# Patient Record
Sex: Female | Born: 1945 | Race: White | Hispanic: No | Marital: Married | Smoking: Never smoker
Health system: Southern US, Community
[De-identification: ages and names within clinical notes are randomized; demographics above are authoritative.]

## PROBLEM LIST (undated history)

## (undated) DIAGNOSIS — I10 Essential (primary) hypertension: Secondary | ICD-10-CM

## (undated) DIAGNOSIS — Z86010 Personal history of colonic polyps: Secondary | ICD-10-CM

## (undated) DIAGNOSIS — J309 Allergic rhinitis, unspecified: Secondary | ICD-10-CM

## (undated) DIAGNOSIS — D509 Iron deficiency anemia, unspecified: Secondary | ICD-10-CM

## (undated) DIAGNOSIS — E785 Hyperlipidemia, unspecified: Secondary | ICD-10-CM

## (undated) HISTORY — PX: CATARACT EXTRACTION: SUR2

## (undated) HISTORY — DX: Iron deficiency anemia, unspecified: D50.9

## (undated) HISTORY — PX: ABDOMINAL HYSTERECTOMY: SHX81

## (undated) HISTORY — DX: Hyperlipidemia, unspecified: E78.5

## (undated) HISTORY — DX: Allergic rhinitis, unspecified: J30.9

## (undated) HISTORY — DX: Essential (primary) hypertension: I10

## (undated) HISTORY — DX: Personal history of colonic polyps: Z86.010

## (undated) HISTORY — PX: TONSILLECTOMY: SUR1361

---

## 1997-11-06 ENCOUNTER — Ambulatory Visit (HOSPITAL_COMMUNITY): Admission: RE | Admit: 1997-11-06 | Discharge: 1997-11-06 | Payer: Self-pay | Admitting: Obstetrics and Gynecology

## 1997-11-13 ENCOUNTER — Ambulatory Visit (HOSPITAL_COMMUNITY): Admission: RE | Admit: 1997-11-13 | Discharge: 1997-11-13 | Payer: Self-pay | Admitting: Obstetrics and Gynecology

## 1999-01-23 ENCOUNTER — Ambulatory Visit (HOSPITAL_COMMUNITY): Admission: RE | Admit: 1999-01-23 | Discharge: 1999-01-23 | Payer: Self-pay | Admitting: Obstetrics and Gynecology

## 1999-01-23 ENCOUNTER — Encounter: Payer: Self-pay | Admitting: Obstetrics and Gynecology

## 1999-01-28 ENCOUNTER — Ambulatory Visit (HOSPITAL_COMMUNITY): Admission: RE | Admit: 1999-01-28 | Discharge: 1999-01-28 | Payer: Self-pay | Admitting: Obstetrics and Gynecology

## 1999-01-28 ENCOUNTER — Encounter: Payer: Self-pay | Admitting: Obstetrics and Gynecology

## 1999-06-20 ENCOUNTER — Other Ambulatory Visit: Admission: RE | Admit: 1999-06-20 | Discharge: 1999-06-20 | Payer: Self-pay | Admitting: Obstetrics and Gynecology

## 1999-10-21 ENCOUNTER — Ambulatory Visit (HOSPITAL_COMMUNITY): Admission: RE | Admit: 1999-10-21 | Discharge: 1999-10-21 | Payer: Self-pay | Admitting: *Deleted

## 2000-05-18 ENCOUNTER — Encounter: Payer: Self-pay | Admitting: Obstetrics and Gynecology

## 2000-05-18 ENCOUNTER — Ambulatory Visit (HOSPITAL_COMMUNITY): Admission: RE | Admit: 2000-05-18 | Discharge: 2000-05-18 | Payer: Self-pay | Admitting: Obstetrics and Gynecology

## 2000-08-19 ENCOUNTER — Other Ambulatory Visit: Admission: RE | Admit: 2000-08-19 | Discharge: 2000-08-19 | Payer: Self-pay | Admitting: Obstetrics and Gynecology

## 2002-11-28 LAB — HM MAMMOGRAPHY: HM Mammogram: NORMAL

## 2004-03-25 ENCOUNTER — Ambulatory Visit (HOSPITAL_COMMUNITY): Admission: RE | Admit: 2004-03-25 | Discharge: 2004-03-25 | Payer: Self-pay | Admitting: *Deleted

## 2004-03-25 ENCOUNTER — Encounter (INDEPENDENT_AMBULATORY_CARE_PROVIDER_SITE_OTHER): Payer: Self-pay | Admitting: *Deleted

## 2007-02-23 ENCOUNTER — Emergency Department (HOSPITAL_COMMUNITY): Admission: EM | Admit: 2007-02-23 | Discharge: 2007-02-23 | Payer: Self-pay | Admitting: Emergency Medicine

## 2007-07-26 ENCOUNTER — Ambulatory Visit (HOSPITAL_COMMUNITY): Admission: RE | Admit: 2007-07-26 | Discharge: 2007-07-26 | Payer: Self-pay | Admitting: *Deleted

## 2007-07-26 ENCOUNTER — Encounter (INDEPENDENT_AMBULATORY_CARE_PROVIDER_SITE_OTHER): Payer: Self-pay | Admitting: *Deleted

## 2007-12-02 ENCOUNTER — Ambulatory Visit: Payer: Self-pay | Admitting: Internal Medicine

## 2007-12-02 DIAGNOSIS — D509 Iron deficiency anemia, unspecified: Secondary | ICD-10-CM | POA: Insufficient documentation

## 2007-12-02 DIAGNOSIS — Z860100 Personal history of colon polyps, unspecified: Secondary | ICD-10-CM

## 2007-12-02 DIAGNOSIS — I10 Essential (primary) hypertension: Secondary | ICD-10-CM | POA: Insufficient documentation

## 2007-12-02 DIAGNOSIS — J309 Allergic rhinitis, unspecified: Secondary | ICD-10-CM | POA: Insufficient documentation

## 2007-12-02 HISTORY — DX: Personal history of colonic polyps: Z86.010

## 2007-12-02 HISTORY — DX: Essential (primary) hypertension: I10

## 2007-12-02 HISTORY — DX: Iron deficiency anemia, unspecified: D50.9

## 2007-12-02 HISTORY — DX: Personal history of colon polyps, unspecified: Z86.0100

## 2007-12-02 HISTORY — DX: Allergic rhinitis, unspecified: J30.9

## 2007-12-06 ENCOUNTER — Ambulatory Visit: Payer: Self-pay | Admitting: Internal Medicine

## 2007-12-06 LAB — CONVERTED CEMR LAB
ALT: 27 units/L (ref 0–35)
AST: 29 units/L (ref 0–37)
Albumin: 3.8 g/dL (ref 3.5–5.2)
Alkaline Phosphatase: 85 units/L (ref 39–117)
BUN: 13 mg/dL (ref 6–23)
Basophils Absolute: 0 10*3/uL (ref 0.0–0.1)
Basophils Relative: 0.8 % (ref 0.0–3.0)
Bilirubin Urine: NEGATIVE
Bilirubin, Direct: 0.2 mg/dL (ref 0.0–0.3)
CO2: 29 meq/L (ref 19–32)
Calcium: 9.4 mg/dL (ref 8.4–10.5)
Chloride: 106 meq/L (ref 96–112)
Cholesterol: 211 mg/dL (ref 0–200)
Creatinine, Ser: 0.6 mg/dL (ref 0.4–1.2)
Crystals: NEGATIVE
Direct LDL: 128.6 mg/dL
Eosinophils Absolute: 0.1 10*3/uL (ref 0.0–0.7)
Eosinophils Relative: 3 % (ref 0.0–5.0)
GFR calc Af Amer: 131 mL/min
GFR calc non Af Amer: 108 mL/min
Glucose, Bld: 103 mg/dL — ABNORMAL HIGH (ref 70–99)
HCT: 36.5 % (ref 36.0–46.0)
HDL: 40.9 mg/dL (ref >39.0)
Hemoglobin, Urine: NEGATIVE
Hemoglobin: 12.9 g/dL (ref 12.0–15.0)
Ketones, ur: NEGATIVE mg/dL
Lymphocytes Relative: 30.7 % (ref 12.0–46.0)
MCHC: 35.4 g/dL (ref 30.0–36.0)
MCV: 92.6 fL (ref 78.0–100.0)
Monocytes Absolute: 0.5 10*3/uL (ref 0.1–1.0)
Monocytes Relative: 13 % — ABNORMAL HIGH (ref 3.0–12.0)
Mucus, UA: NEGATIVE
Neutro Abs: 2.3 10*3/uL (ref 1.4–7.7)
Neutrophils Relative %: 52.5 % (ref 43.0–77.0)
Nitrite: NEGATIVE
Platelets: 205 10*3/uL (ref 150–400)
Potassium: 3.7 meq/L (ref 3.5–5.1)
RBC / HPF: NONE SEEN
RBC: 3.94 M/uL (ref 3.87–5.11)
RDW: 12.2 % (ref 11.5–14.6)
Sodium: 142 meq/L (ref 135–145)
Specific Gravity, Urine: 1.01 (ref 1.000–1.03)
TSH: 0.86 microintl units/mL (ref 0.35–5.50)
Total Bilirubin: 1 mg/dL (ref 0.3–1.2)
Total CHOL/HDL Ratio: 5.2
Total Protein, Urine: NEGATIVE mg/dL
Total Protein: 6.7 g/dL (ref 6.0–8.3)
Triglycerides: 135 mg/dL (ref 0–149)
Urine Glucose: NEGATIVE mg/dL
Urobilinogen, UA: 0.2 (ref 0.0–1.0)
VLDL: 27 mg/dL (ref 0–40)
WBC: 4.2 10*3/uL — ABNORMAL LOW (ref 4.5–10.5)
pH: 7.5 (ref 5.0–8.0)

## 2009-01-24 ENCOUNTER — Telehealth: Payer: Self-pay | Admitting: Internal Medicine

## 2009-03-28 ENCOUNTER — Ambulatory Visit: Payer: Self-pay | Admitting: Internal Medicine

## 2009-03-29 LAB — CONVERTED CEMR LAB
ALT: 24 units/L (ref 0–35)
AST: 24 units/L (ref 0–37)
Albumin: 4.7 g/dL (ref 3.5–5.2)
Alkaline Phosphatase: 86 units/L (ref 39–117)
BUN: 17 mg/dL (ref 6–23)
Basophils Absolute: 0.1 10*3/uL (ref 0.0–0.1)
Basophils Relative: 1.5 % (ref 0.0–3.0)
Bilirubin Urine: NEGATIVE
Bilirubin, Direct: 0.2 mg/dL (ref 0.0–0.3)
CO2: 28 meq/L (ref 19–32)
Calcium: 9.9 mg/dL (ref 8.4–10.5)
Chloride: 101 meq/L (ref 96–112)
Cholesterol: 224 mg/dL — ABNORMAL HIGH (ref 0–200)
Creatinine, Ser: 0.7 mg/dL (ref 0.4–1.2)
Direct LDL: 155.1 mg/dL
Eosinophils Absolute: 0.1 10*3/uL (ref 0.0–0.7)
Eosinophils Relative: 1.3 % (ref 0.0–5.0)
GFR calc non Af Amer: 89.76 mL/min (ref >60)
Glucose, Bld: 102 mg/dL — ABNORMAL HIGH (ref 70–99)
HCT: 42.1 % (ref 36.0–46.0)
HDL: 50.5 mg/dL (ref >39.00)
Hemoglobin, Urine: NEGATIVE
Hemoglobin: 14 g/dL (ref 12.0–15.0)
Ketones, ur: 40 mg/dL
Leukocytes, UA: NEGATIVE
Lymphocytes Relative: 27.8 % (ref 12.0–46.0)
Lymphs Abs: 1.5 10*3/uL (ref 0.7–4.0)
MCHC: 33.3 g/dL (ref 30.0–36.0)
MCV: 94.4 fL (ref 78.0–100.0)
Monocytes Absolute: 0.5 10*3/uL (ref 0.1–1.0)
Monocytes Relative: 9.5 % (ref 3.0–12.0)
Neutro Abs: 3.1 10*3/uL (ref 1.4–7.7)
Neutrophils Relative %: 59.9 % (ref 43.0–77.0)
Nitrite: NEGATIVE
Platelets: 224 10*3/uL (ref 150.0–400.0)
Potassium: 3.1 meq/L — ABNORMAL LOW (ref 3.5–5.1)
RBC: 4.46 M/uL (ref 3.87–5.11)
RDW: 12.3 % (ref 11.5–14.6)
Sodium: 139 meq/L (ref 135–145)
Specific Gravity, Urine: 1.02 (ref 1.000–1.030)
TSH: 0.47 microintl units/mL (ref 0.35–5.50)
Total Bilirubin: 1.2 mg/dL (ref 0.3–1.2)
Total CHOL/HDL Ratio: 4
Total Protein, Urine: NEGATIVE mg/dL
Total Protein: 7.8 g/dL (ref 6.0–8.3)
Triglycerides: 169 mg/dL — ABNORMAL HIGH (ref 0.0–149.0)
Urine Glucose: NEGATIVE mg/dL
Urobilinogen, UA: 0.2 (ref 0.0–1.0)
VLDL: 33.8 mg/dL (ref 0.0–40.0)
WBC: 5.3 10*3/uL (ref 4.5–10.5)
pH: 6 (ref 5.0–8.0)

## 2009-04-26 ENCOUNTER — Ambulatory Visit: Payer: Self-pay | Admitting: Internal Medicine

## 2009-04-26 LAB — CONVERTED CEMR LAB
ALT: 49 units/L — ABNORMAL HIGH (ref 0–35)
AST: 49 units/L — ABNORMAL HIGH (ref 0–37)
Albumin: 3.9 g/dL (ref 3.5–5.2)
Alkaline Phosphatase: 108 units/L (ref 39–117)
BUN: 12 mg/dL (ref 6–23)
Bilirubin, Direct: 0.1 mg/dL (ref 0.0–0.3)
CO2: 29 meq/L (ref 19–32)
Calcium: 9.5 mg/dL (ref 8.4–10.5)
Chloride: 107 meq/L (ref 96–112)
Cholesterol: 182 mg/dL (ref 0–200)
Creatinine, Ser: 0.6 mg/dL (ref 0.4–1.2)
Direct LDL: 89.8 mg/dL
GFR calc non Af Amer: 107.2 mL/min (ref >60)
Glucose, Bld: 101 mg/dL — ABNORMAL HIGH (ref 70–99)
HDL: 42 mg/dL (ref >39.00)
Potassium: 4.1 meq/L (ref 3.5–5.1)
Sodium: 142 meq/L (ref 135–145)
Total Bilirubin: 0.6 mg/dL (ref 0.3–1.2)
Total CHOL/HDL Ratio: 4
Total Protein: 6.8 g/dL (ref 6.0–8.3)
Triglycerides: 309 mg/dL — ABNORMAL HIGH (ref 0.0–149.0)
VLDL: 61.8 mg/dL — ABNORMAL HIGH (ref 0.0–40.0)

## 2009-11-29 ENCOUNTER — Ambulatory Visit: Payer: Self-pay | Admitting: Internal Medicine

## 2010-03-28 NOTE — Assessment & Plan Note (Signed)
Summary: FLU SHOT/NWS  Nurse Visit   Allergies: No Known Drug Allergies  Orders Added: 1)  Admin 1st Vaccine [90471] 2)  Flu Vaccine 34yrs + [52841]     Flu Vaccine Consent Questions     Do you have a history of severe allergic reactions to this vaccine? no    Any prior history of allergic reactions to egg and/or gelatin? no    Do you have a sensitivity to the preservative Thimersol? no    Do you have a past history of Guillan-Barre Syndrome? no    Do you currently have an acute febrile illness? no    Have you ever had a severe reaction to latex? no    Vaccine information given and explained to patient? yes    Are you currently pregnant? no    Lot Number:[id number]   Exp Date:08/24/2010   Site Given  Right Deltoid IM

## 2010-03-28 NOTE — Assessment & Plan Note (Signed)
Summary: RUNNING OUT OF BP MEDICINE/#/CD   Vital Signs:  Patient profile:   65 year old female Height:      65 inches Weight:      189 pounds BMI:     31.56 O2 Sat:      97 % on Room air Temp:     98.1 degrees F oral Pulse rate:   102 / minute BP sitting:   172 / 82  (left arm) Cuff size:   regular  Vitals Entered ByZella Ball Ewing (March 28, 2009 9:57 AM)  O2 Flow:  Room air  CC: med refills/RE   CC:  med refills/RE.  History of Present Illness: BP at home multpile time < 140/90;  Pt denies CP, sob, doe, wheezing, orthopnea, pnd, worsening LE edema, palps, dizziness or syncope  Pt denies new neuro symptoms such as headache, facial or extremity weakness Overall good complaince, trying to avoid low salt in the diet.    Preventive Screening-Counseling & Management      Drug Use:  no.    Problems Prior to Update: 1)  Preventive Health Care  (ICD-V70.0) 2)  Colonic Polyps, Hx of  (ICD-V12.72) 3)  Hypertension  (ICD-401.9) 4)  Allergic Rhinitis  (ICD-477.9) 5)  Anemia-iron Deficiency  (ICD-280.9)  Medications Prior to Update: 1)  Hydrochlorothiazide 25 Mg Tabs (Hydrochlorothiazide) .Marland Kitchen.. 1 By Mouth Daily 2)  Adult Aspirin Ec Low Strength 81 Mg Tbec (Aspirin) .Marland Kitchen.. 1 By Mouth Once Daily  Current Medications (verified): 1)  Hydrochlorothiazide 25 Mg Tabs (Hydrochlorothiazide) .Marland Kitchen.. 1 By Mouth Daily 2)  Adult Aspirin Ec Low Strength 81 Mg Tbec (Aspirin) .Marland Kitchen.. 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         all otherwise negative per pt -   Physical Exam  General:  alert and overweight-appearing.   Head:  normocephalic and atraumatic.   Eyes:  vision grossly intact, pupils equal, and pupils round.   Ears:  R ear normal and L ear normal.   Nose:  no external deformity and no nasal discharge.   Mouth:  no gingival abnormalities and pharynx pink and moist.   Neck:  supple and no masses.   Lungs:  normal respiratory effort and normal breath sounds.   Heart:  normal rate and regular rhythm.   Abdomen:  soft, non-tender, and normal bowel sounds.   Msk:  no  joint tenderness and no joint swelling.   Extremities:  no edema, no erythema  Neurologic:  cranial nerves II-XII intact and strength normal in all extremities.     Impression & Recommendations:  Problem # 1:  Preventive Health Care (ICD-V70.0)  Overall doing well, age appropriate education and counseling updated and referral for appropriate preventive services done unless declined, immunizations up to date or declined, diet counseling  done if overweight, urged to quit smoking if smokes , most recent labs reviewed and current ordered if appropriate, ecg reviewed or declined (interpretation per ECG scanned in the EMR if done); information regarding Medicare Prevention requirements given if appropriate   Orders: TLB-BMP (Basic Metabolic Panel-BMET) (80048-METABOL) TLB-CBC Platelet - w/Differential (85025-CBCD) TLB-Hepatic/Liver Function Pnl (80076-HEPATIC) TLB-Lipid Panel (80061-LIPID) TLB-TSH (Thyroid Stimulating Hormone) (84443-TSH) TLB-Udip ONLY (81003-UDIP)  Problem # 2:  HYPERTENSION (ICD-401.9)  Her updated medication list for this problem includes:    Hydrochlorothiazide 25 Mg Tabs (Hydrochlorothiazide) .Marland Kitchen... 1 by mouth daily  BP today: 172/82 Prior BP: 156/78 (12/02/2007)  Labs Reviewed: K+: 3.7 (12/06/2007) Creat: : 0.6 (12/06/2007)   Chol: 211 (12/06/2007)   HDL: 40.9 (12/06/2007)   LDL: DEL (12/06/2007)   TG: 135 (12/06/2007) uncontrolled;  re-start tx above, f/u BP at home closely as she does, and next visit  Complete Medication List: 1)  Hydrochlorothiazide 25 Mg Tabs (Hydrochlorothiazide) .Marland Kitchen.. 1 by mouth daily 2)  Adult Aspirin Ec Low Strength 81 Mg Tbec (Aspirin) .Marland Kitchen.. 1 by mouth once daily  Other Orders: Admin 1st Vaccine (40347) Flu Vaccine 60yrs + [phone])  Patient Instructions: 1)  Continue all previous medications as before this visit 2)  continue to monitor your BP at home 3)  you had the flu shot today 4)  please call for the yearly mammogram 5)  Please go to the Lab in the basement for your blood and/or urine tests today  6)  Please schedule a follow-up appointment in 1 year or sooner if needed Prescriptions: HYDROCHLOROTHIAZIDE 25 MG TABS (HYDROCHLOROTHIAZIDE) 1 by mouth daily  #100 x 3   Entered and Authorized by:   Corwin Levins MD   Signed by:   Corwin Levins MD on 03/28/2009   Method used:   Print then Give to Patient   RxID:   [id number]      Flu Vaccine Consent Questions      Do you have a history of severe allergic reactions to this vaccine? no    Any prior history of allergic reactions to egg and/or gelatin? no    Do you have a sensitivity to the preservative Thimersol? no    Do you have a past history of Guillan-Barre Syndrome? no    Do you currently have an acute febrile illness? no    Have you ever had a severe reaction to latex? no    Vaccine information given and explained to patient? yes    Are you currently pregnant? no    Lot Number:[id number]   Exp Date:08/23/2009   Site Given  Left Deltoid IMlu  Immunization History:  Tetanus/Td Immunization History:    Tetanus/Td:  historical (02/25/2008)    Immunization History:  Tetanus/Td Immunization History:    Tetanus/Td:  Historical (02/25/2008)

## 2010-07-09 NOTE — Op Note (Signed)
NAME:  Alyssa Ho, Alyssa Ho                ACCOUNT NO.:  [id number]   MEDICAL RECORD NO.:  [id number]          PATIENT TYPE:  AMB   LOCATION:  ENDO                         FACILITY:  [facility]   PHYSICIAN:  Georgiana Spinner, M.D.    DATE OF BIRTH:  1945-07-13   DATE OF PROCEDURE:  DATE OF DISCHARGE:                               OPERATIVE REPORT   PROCEDURE:  Colonoscopy.   INDICATIONS:  Colon polyps.   ANESTHESIA:  Fentanyl 75 mcg, Versed 7 mg.   DESCRIPTION OF PROCEDURE:  With the patient mildly sedated in the left  lateral decubitus position, the Pentax videoscopic colonoscope was  inserted in the rectum and passed under direct vision with pressure  applied to reach the cecum, identified by the ileocecal valve and  appendiceal orifice, both of which were photographed.  From this point,  the colonoscope was slowly withdrawn taking circumferential views of the  colonic mucosa, stopping at 20 cm from the anal verge at which point a  small polyp was seen, photographed and removed using hot biopsy forceps  technique, setting of 20/150 blended current.  The endoscope was  withdrawn to the rectum which appeared normal on direct and showed  hemorrhoids on retroflexed view.  The endoscope was straightened and  withdrawn.  The patient's vital signs and pulse oximeter remained  stable.  The patient tolerated the procedure well without apparent  complications.   FINDINGS:  Thickened sigmoid colon, presumably secondary to diverticular  disease and muscle hypertrophy, internal hemorrhoids and a small polyp  at 20 cm from the anal verge.   PLAN:  Await biopsy report.  The patient will call me for results and  follow-up with me as an outpatient as needed.           ______________________________  Georgiana Spinner, M.D.     GMO/MEDQ  D:  07/26/2007  T:  07/26/2007  Job:  [id number]   cc:   Lenon Curt. Chilton Si, M.D.  Fax: [phone]

## 2010-07-12 NOTE — Procedures (Signed)
[facility]  Patient:    Alyssa Ho, Alyssa Ho                    MRN: [id number] Proc. Date: 10/21/99 Adm. Date:  [id number] Attending:  Sabino Gasser                           Procedure Report  PROCEDURE:  Colonoscopy with biopsy.  INDICATIONS:  Colon polyp, family history of colon cancer in both parents.  ANESTHESIA:  Demerol 60 gm, Versed 6 mg.  DESCRIPTION OF PROCEDURE:  Patient mildly sedated in the left lateral decubitus position.  The Olympus videoscopic colonoscope was inserted into the rectum, passed under direct vision to the cecum.  The cecum identified by ileocecal valve and appendiceal orifice, both of which were photographed. From this point the colonoscope was slowly withdrawn to circumferentially view the entire colonic mucosa, stopping only in the rectum which appeared normal other than a very small polyp that was seen, photographed, and removed using hot biopsy forceps technique.  This area was eradicated.  The endoscope was then placed in retroflex to view of the anal canal from above and hemorrhoids were seen and photographed.  The endoscope was straightened and withdrawn. Patients vital signs and pulse oximetry stable.  Patient tolerated the procedure well without apparent complications.  FINDINGS:  Small polyp of rectum approximately 5 cm from the anal verge.  PLAN:  Will have patient follow up with me as an outpatient for a repeat examination in about three years. DD:  10/21/99 TD:  10/21/99 Job: [id number]

## 2010-07-12 NOTE — Op Note (Signed)
NAMEISELA, STANTZ                ACCOUNT NO.:  [id number]   MEDICAL RECORD NO.:  [id number]          PATIENT TYPE:  AMB   LOCATION:  ENDO                         FAC[facility]   PHYSICIAN:  Georgiana Spinner, M.D.    DATE OF BIRTH:  [birth year]   DATE OF PROCEDURE:  03/25/2004  DATE OF DISCHARGE:                                 OPERATIVE REPORT   PROCEDURE:  Colonoscopy with biopsy.   INDICATIONS:  Colon polyps.   ANESTHESIA:  Demerol 60 mg, Versed 7.5 mg.   PROCEDURE:  With the patient mildly sedated in the left lateral decubitus  position, the Olympus videoscopic colonoscope was inserted into the rectum  and passed under direct vision to the cecum, identified by ileocecal valve  and appendiceal orifice, both of which were photographed.  From this point  the colonoscope was slowly withdrawn, taking circumferential views of  colonic mucosa as we withdrew to the rectum, stopping only at 20 cm from the  anal verge, at which point a small polyp was seen, photographed and removed  using hot biopsy forceps technique, setting of 20/20 blended current.  The  endoscope was then placed in retroflexion in the rectum, which showed  internal hemorrhoids.  The endoscope was straightened and withdrawn.  The  patient's vital signs and pulse oximeter remained stable.  The patient  tolerated procedure well without apparent complications.   FINDINGS:  1.  Polyp at 20 cm from the anal verge.  2.  Internal hemorrhoids.  3.  Otherwise an unremarkable colonoscopic examination to the cecum.   PLAN:  Await biopsy report.  The patient will call me for results and follow-  up with me as an outpatient.      GMO/MEDQ  D:  03/25/2004  T:  03/25/2004  Job:  [id number]   cc:   Lenon Curt. Chilton Si, M.D.  [address]  [place]  [place] [address]  Fax: [phone]

## 2010-07-18 ENCOUNTER — Encounter: Payer: Self-pay | Admitting: Internal Medicine

## 2010-07-18 ENCOUNTER — Ambulatory Visit (INDEPENDENT_AMBULATORY_CARE_PROVIDER_SITE_OTHER): Payer: BC Managed Care – PPO | Admitting: Internal Medicine

## 2010-07-18 DIAGNOSIS — H9191 Unspecified hearing loss, right ear: Secondary | ICD-10-CM

## 2010-07-18 DIAGNOSIS — H9190 Unspecified hearing loss, unspecified ear: Secondary | ICD-10-CM

## 2010-07-18 DIAGNOSIS — R42 Dizziness and giddiness: Secondary | ICD-10-CM | POA: Insufficient documentation

## 2010-07-18 DIAGNOSIS — Z0000 Encounter for general adult medical examination without abnormal findings: Secondary | ICD-10-CM | POA: Insufficient documentation

## 2010-07-18 DIAGNOSIS — J309 Allergic rhinitis, unspecified: Secondary | ICD-10-CM

## 2010-07-18 DIAGNOSIS — Z0001 Encounter for general adult medical examination with abnormal findings: Secondary | ICD-10-CM | POA: Insufficient documentation

## 2010-07-18 DIAGNOSIS — H6590 Unspecified nonsuppurative otitis media, unspecified ear: Secondary | ICD-10-CM

## 2010-07-18 DIAGNOSIS — H6592 Unspecified nonsuppurative otitis media, left ear: Secondary | ICD-10-CM | POA: Insufficient documentation

## 2010-07-18 MED ORDER — CEPHALEXIN 500 MG PO CAPS: 500.0000 mg | ORAL_CAPSULE | Freq: Four times a day (QID) | ORAL | Status: AC

## 2010-07-18 MED ORDER — FEXOFENADINE HCL 180 MG PO TABS: 180.0000 mg | ORAL_TABLET | Freq: Every day | ORAL | Status: DC

## 2010-07-18 MED ORDER — MECLIZINE HCL 12.5 MG PO TABS: 12.5000 mg | ORAL_TABLET | Freq: Three times a day (TID) | ORAL | Status: AC | PRN

## 2010-07-18 NOTE — Assessment & Plan Note (Signed)
Improved with wax removal by irrigation,  to f/u any worsening symptoms or concerns

## 2010-07-18 NOTE — Patient Instructions (Addendum)
Your right ear was irrigated today Take all new medications as prescribed Continue all other medications as before You can also take Mucinex (or it's generic off brand) for congestion and left ear fullness Please return in 6 mo with Lab testing done 3-5 days before (We will have the office call) Have Fun on your Trip coming up.

## 2010-07-18 NOTE — Assessment & Plan Note (Addendum)
Mild, for antibx course,  to f/u any worsening symptoms or concerns, and mucinex otc prn

## 2010-07-18 NOTE — Assessment & Plan Note (Signed)
Mild nasal symtpoms - for allegra prn ,  to f/u any worsening symptoms or concerns

## 2010-07-18 NOTE — Assessment & Plan Note (Signed)
Mild, likely due to otitis, for meclizine prn,  to f/u any worsening symptoms or concerns

## 2010-07-20 ENCOUNTER — Other Ambulatory Visit: Payer: Self-pay | Admitting: Internal Medicine

## 2010-07-22 ENCOUNTER — Encounter: Payer: Self-pay | Admitting: Internal Medicine

## 2010-07-22 NOTE — Progress Notes (Signed)
  Subjective:    Patient ID: Alyssa Ho, female    DOB: Dec 05, 1945, 65 y.o.   MRN: [id number]  HPI  Here with acute onset 3 days left ear pain, pressure, fever, vertigo with mild nausea, HA and slight ST,  Pt denies chest pain, increased sob or doe, wheezing, orthopnea, PND, increased LE swelling, palpitations, dizziness or syncope.  Pt denies new neurological symptoms such as new headache, or facial or extremity weakness or numbness   Pt denies polydipsia, polyuria.  Pt states overall good compliance with meds, trying to follow lower chol diet, wt overall stable but little exercise however. Does have several wks ongoing nasal allergy symptoms with clear congestion, itch and sneeze, without fever, pain, ST, cough or wheezing.  Also with some decrased hearing only on the right  - ? Wax impaction.No vomiting or other neuro symptoms.   Pt denies fever, wt loss, night sweats, loss of appetite, or other constitutional symptoms except with the current symptoms Past Medical History  Diagnosis Date  . ALLERGIC RHINITIS 12/02/2007  . ANEMIA-IRON DEFICIENCY 12/02/2007  . COLONIC POLYPS, HX OF 12/02/2007  . HYPERTENSION 12/02/2007   Past Surgical History  Procedure Date  . Abdominal hysterectomy   . Cataract extraction   . Tonsillectomy     reports that she has never smoked. She does not have any smokeless tobacco history on file. She reports that she does not drink alcohol or use illicit drugs. family history includes Alcohol abuse in her father and paternal grandfather; Cancer in her father and mother; Diabetes in her father and paternal uncle; Heart disease in her paternal grandfather; and Stroke in her paternal grandfather. No Known Allergies No current outpatient prescriptions on file prior to visit.    Review of Systems All otherwise neg per pt     Objective:   Physical Exam BP 160/80  Pulse 91  Temp(Src) 98 F (36.7 C) (Oral)  Ht 5\' 5"  (1.651 m)  Wt 189 lb 6 oz (85.9 kg)  BMI 31.51  kg/m2  SpO2 97% Physical Exam  VS noted Constitutional: Pt appears well-developed and well-nourished.  HENT: Head: Normocephalic.  Right Ear: External ear normal. After wax removed Left Ear: External ear normal.  Bilat tm's severe  Erythema left more than right, with effusion on left  Sinus nontender.  Pharynx mild erythema Eyes: Conjunctivae and EOM are normal. Pupils are equal, round, and reactive to light.  Neck: Normal range of motion. Neck supple.  Cardiovascular: Normal rate and regular rhythm.   Pulmonary/Chest: Effort normal and breath sounds normal.  Abd:  Soft, NT, non-distended, + BS Neurological: Pt is alert. No cranial nerve deficit. motor intact, gait normal Skin: Skin is warm. No erythema.  Psychiatric: Pt behavior is normal. Thought content normal.         Assessment & Plan:

## 2010-11-25 ENCOUNTER — Other Ambulatory Visit: Payer: Self-pay | Admitting: Internal Medicine

## 2010-12-12 ENCOUNTER — Ambulatory Visit (INDEPENDENT_AMBULATORY_CARE_PROVIDER_SITE_OTHER): Payer: BC Managed Care – PPO | Admitting: *Deleted

## 2010-12-12 DIAGNOSIS — Z23 Encounter for immunization: Secondary | ICD-10-CM

## 2010-12-12 NOTE — Progress Notes (Signed)
Addended by: Merrilyn Puma on: 12/12/2010 03:00 PM   Modules accepted: Orders

## 2011-01-20 ENCOUNTER — Ambulatory Visit: Payer: BC Managed Care – PPO | Admitting: Internal Medicine

## 2011-01-27 ENCOUNTER — Ambulatory Visit: Payer: BC Managed Care – PPO | Admitting: Internal Medicine

## 2011-02-07 ENCOUNTER — Other Ambulatory Visit (INDEPENDENT_AMBULATORY_CARE_PROVIDER_SITE_OTHER): Payer: BC Managed Care – PPO

## 2011-02-07 DIAGNOSIS — Z0000 Encounter for general adult medical examination without abnormal findings: Secondary | ICD-10-CM

## 2011-02-07 DIAGNOSIS — I10 Essential (primary) hypertension: Secondary | ICD-10-CM

## 2011-02-07 LAB — TSH: TSH: 0.96 u[IU]/mL (ref 0.35–5.50)

## 2011-02-07 LAB — CBC WITH DIFFERENTIAL/PLATELET
Eosinophils Relative: 2.3 % (ref 0.0–5.0)
HCT: 35.9 % — ABNORMAL LOW (ref 36.0–46.0)
Hemoglobin: 12.3 g/dL (ref 12.0–15.0)
MCHC: 34.2 g/dL (ref 30.0–36.0)
Monocytes Absolute: 0.5 10*3/uL (ref 0.1–1.0)
Neutrophils Relative %: 58.6 % (ref 43.0–77.0)
Platelets: 180 10*3/uL (ref 150.0–400.0)
RBC: 3.78 Mil/uL — ABNORMAL LOW (ref 3.87–5.11)

## 2011-02-07 LAB — HEPATIC FUNCTION PANEL
ALT: 25 U/L (ref 0–35)
AST: 27 U/L (ref 0–37)
Albumin: 4 g/dL (ref 3.5–5.2)
Alkaline Phosphatase: 85 U/L (ref 39–117)
Total Protein: 7.2 g/dL (ref 6.0–8.3)

## 2011-02-07 LAB — BASIC METABOLIC PANEL
CO2: 27 mEq/L (ref 19–32)
Chloride: 110 mEq/L (ref 96–112)
Glucose, Bld: 107 mg/dL — ABNORMAL HIGH (ref 70–99)
Potassium: 4 mEq/L (ref 3.5–5.1)
Sodium: 143 mEq/L (ref 135–145)

## 2011-02-07 LAB — URINALYSIS, ROUTINE W REFLEX MICROSCOPIC
Bilirubin Urine: NEGATIVE
Hgb urine dipstick: NEGATIVE
Nitrite: NEGATIVE
Urobilinogen, UA: 1 (ref 0.0–1.0)
pH: 8.5 (ref 5.0–8.0)

## 2011-02-12 ENCOUNTER — Encounter: Payer: Self-pay | Admitting: Internal Medicine

## 2011-02-12 ENCOUNTER — Ambulatory Visit (INDEPENDENT_AMBULATORY_CARE_PROVIDER_SITE_OTHER): Payer: Medicare Other | Admitting: Internal Medicine

## 2011-02-12 VITALS — BP 176/96 | HR 85 | Temp 98.2°F | Ht 65.0 in | Wt 188.0 lb

## 2011-02-12 DIAGNOSIS — Z0000 Encounter for general adult medical examination without abnormal findings: Secondary | ICD-10-CM

## 2011-02-12 DIAGNOSIS — I10 Essential (primary) hypertension: Secondary | ICD-10-CM

## 2011-02-12 MED ORDER — PNEUMOCOCCAL VAC POLYVALENT 25 MCG/0.5ML IJ INJ: 0.5000 mL | INJECTION | Freq: Once | INTRAMUSCULAR | Status: DC

## 2011-02-12 MED ORDER — POTASSIUM CHLORIDE CRYS ER 10 MEQ PO TBCR: 10.0000 meq | EXTENDED_RELEASE_TABLET | Freq: Every day | ORAL | Status: DC

## 2011-02-12 MED ORDER — LOSARTAN POTASSIUM 50 MG PO TABS: 50.0000 mg | ORAL_TABLET | Freq: Every day | ORAL | Status: DC

## 2011-02-12 MED ORDER — HYDROCHLOROTHIAZIDE 25 MG PO TABS: 25.0000 mg | ORAL_TABLET | Freq: Every day | ORAL | Status: DC

## 2011-02-12 NOTE — Assessment & Plan Note (Signed)
Mild uncontrolled, to add the losartan 50 mg per day, f/u BP at home and next visit, consider increase to 100 mg if not singificantly improved by at least 10 mm Hg

## 2011-02-12 NOTE — Patient Instructions (Addendum)
You had the pneumonia shot today Please remember to followup with your yearly mammogram - you should consider [facility] on [address], or [facility] on [address]. OK to take the [employer] generic as needed only Please start the losartan 50 mg per day, in addition to the fluid pill, and potassium pill After one wk, please start checking BP's as you normally do, and call if not improved, to have the medication increased to the 100/25 mg strength.  Your BP goal is to be at least average less than 140/90, though 110/85 would be better. Continue all other medications as before You may wish to call [facility] to verify your followup recommendation for the colonoscopy you last had in 2009; we had listed you to be due at 5 yrs. Please return in 1 year for your yearly visit, or sooner if needed, with Lab testing done 3-5 days before

## 2011-02-12 NOTE — Progress Notes (Signed)
Subjective:    Patient ID: [name], female    DOB: Jun 26, 1945, 65 y.o.   MRN: [id number]  HPI  Here for wellness and f/u;  Overall doing ok;  Pt denies CP, worsening SOB, DOE, wheezing, orthopnea, PND, worsening LE edema, palpitations, dizziness or syncope.  Pt denies neurological change such as new Headache, facial or extremity weakness.  Pt denies polydipsia, polyuria, or low sugar symptoms. Pt states overall good compliance with treatment and medications, good tolerability, and trying to follow lower cholesterol diet.  Pt denies worsening depressive symptoms, suicidal ideation or panic. No fever, wt loss, night sweats, loss of appetite, or other constitutional symptoms.  Pt states good ability with ADL's, low fall risk, home safety reviewed and adequate, no significant changes in hearing or vision, and occasionally active with exercise. Has been doing much better with less fat/lower chol diet, wt stable, hard to find time to exercise, BP at home have been on average at or above 140 sbp. Past Medical History  Diagnosis Date  . ALLERGIC RHINITIS 12/02/2007  . ANEMIA-IRON DEFICIENCY 12/02/2007  . COLONIC POLYPS, HX OF 12/02/2007  . HYPERTENSION 12/02/2007   Past Surgical History  Procedure Date  . Abdominal hysterectomy   . Cataract extraction   . Tonsillectomy     reports that she has never smoked. She does not have any smokeless tobacco history on file. She reports that she does not drink alcohol or use illicit drugs. family history includes Alcohol abuse in her father and paternal grandfather; Cancer in her father and mother; Diabetes in her father and paternal uncle; Heart disease in her paternal grandfather; and Stroke in her paternal grandfather. No Known Allergies Current Outpatient Prescriptions on File Prior to Visit  Medication Sig Dispense Refill  . aspirin 81 MG tablet Take 81 mg by mouth daily.        . fexofenadine (ALLEGRA) 180 MG tablet Take 1 tablet (180 mg total) by mouth  daily.  30 tablet  2  . meclizine (ANTIVERT) 12.5 MG tablet Take 1 tablet (12.5 mg total) by mouth 3 (three) times daily as needed.  30 tablet  1   Review of Systems Review of Systems  Constitutional: Negative for diaphoresis, activity change, appetite change and unexpected weight change.  HENT: Negative for hearing loss, ear pain, facial swelling, mouth sores and neck stiffness.   Eyes: Negative for pain, redness and visual disturbance.  Respiratory: Negative for shortness of breath and wheezing.   Cardiovascular: Negative for chest pain and palpitations.  Gastrointestinal: Negative for diarrhea, blood in stool, abdominal distention and rectal pain.  Genitourinary: Negative for hematuria, flank pain and decreased urine volume.  Musculoskeletal: Negative for myalgias and joint swelling.  Skin: Negative for color change and wound.  Neurological: Negative for syncope and numbness.  Hematological: Negative for adenopathy.  Psychiatric/Behavioral: Negative for hallucinations, self-injury, decreased concentration and agitation.      Objective:   Physical Exam BP 176/96  Pulse 85  Temp(Src) 98.2 F (36.8 C) (Oral)  Ht 5\' 5"  (1.651 m)  Wt 188 lb (85.276 kg)  BMI 31.28 kg/m2  SpO2 97% Physical Exam  VS noted Constitutional: Pt is oriented to person, place, and time. Appears well-developed and well-nourished.  HENT:  Head: Normocephalic and atraumatic.  Right Ear: External ear normal.  Left Ear: External ear normal.  Nose: Nose normal.  Mouth/Throat: Oropharynx is clear and moist.  Eyes: Conjunctivae and EOM are normal. Pupils are equal, round, and reactive to light.  Neck:  Normal range of motion. Neck supple. No JVD present. No tracheal deviation present.  Cardiovascular: Normal rate, regular rhythm, normal heart sounds and intact distal pulses.   Pulmonary/Chest: Effort normal and breath sounds normal.  Abdominal: Soft. Bowel sounds are normal. There is no tenderness.    Musculoskeletal: Normal range of motion. Exhibits no edema.  Lymphadenopathy:  Has no cervical adenopathy.  Neurological: Pt is alert and oriented to person, place, and time. Pt has normal reflexes. No cranial nerve deficit.  Skin: Skin is warm and dry. No rash noted.  Psychiatric:  Has  normal mood and affect. Behavior is normal.     Assessment & Plan:

## 2011-02-12 NOTE — Assessment & Plan Note (Signed)
Overall doing well, age appropriate education and counseling updated, referrals for preventative services and immunizations addressed, dietary and smoking counseling addressed, most recent labs and ECG reviewed.  I have personally reviewed and have noted: 1) the patient's medical and social history 2) The pt's use of alcohol, tobacco, and illicit drugs 3) The patient's current medications and supplements 4) Functional ability including ADL's, fall risk, home safety risk, hearing and visual impairment 5) Diet and physical activities 6) Evidence for depression or mood disorder 7) The patient's height, weight, and BMI have been recorded in the chart I have made referrals, and provided counseling and education based on review of the above For pneumovax today.

## 2011-08-20 ENCOUNTER — Other Ambulatory Visit: Payer: Self-pay | Admitting: Internal Medicine

## 2012-01-28 ENCOUNTER — Other Ambulatory Visit: Payer: Self-pay | Admitting: Internal Medicine

## 2012-05-04 ENCOUNTER — Other Ambulatory Visit: Payer: Self-pay | Admitting: Internal Medicine

## 2012-05-28 ENCOUNTER — Other Ambulatory Visit: Payer: Self-pay | Admitting: Internal Medicine

## 2012-07-12 ENCOUNTER — Ambulatory Visit: Payer: Medicare Other | Admitting: Internal Medicine

## 2012-07-16 ENCOUNTER — Telehealth: Payer: Self-pay

## 2012-07-16 DIAGNOSIS — Z0000 Encounter for general adult medical examination without abnormal findings: Secondary | ICD-10-CM

## 2012-07-16 NOTE — Telephone Encounter (Signed)
CPX labs entered  

## 2012-07-21 ENCOUNTER — Ambulatory Visit: Payer: Medicare Other | Admitting: Internal Medicine

## 2012-07-26 ENCOUNTER — Other Ambulatory Visit (INDEPENDENT_AMBULATORY_CARE_PROVIDER_SITE_OTHER): Payer: Medicare Other

## 2012-07-26 DIAGNOSIS — I10 Essential (primary) hypertension: Secondary | ICD-10-CM

## 2012-07-26 DIAGNOSIS — Z0000 Encounter for general adult medical examination without abnormal findings: Secondary | ICD-10-CM

## 2012-07-26 LAB — CBC WITH DIFFERENTIAL/PLATELET
Basophils Absolute: 0 10*3/uL (ref 0.0–0.1)
Eosinophils Absolute: 0.1 10*3/uL (ref 0.0–0.7)
HCT: 35.3 % — ABNORMAL LOW (ref 36.0–46.0)
Hemoglobin: 12.3 g/dL (ref 12.0–15.0)
Lymphs Abs: 1.8 10*3/uL (ref 0.7–4.0)
MCHC: 34.7 g/dL (ref 30.0–36.0)
Monocytes Absolute: 0.5 10*3/uL (ref 0.1–1.0)
Neutro Abs: 3.7 10*3/uL (ref 1.4–7.7)
Platelets: 222 10*3/uL (ref 150.0–400.0)
RDW: 12.6 % (ref 11.5–14.6)

## 2012-07-26 LAB — HEPATIC FUNCTION PANEL
Alkaline Phosphatase: 67 U/L (ref 39–117)
Bilirubin, Direct: 0.1 mg/dL (ref 0.0–0.3)
Total Bilirubin: 1 mg/dL (ref 0.3–1.2)
Total Protein: 7.2 g/dL (ref 6.0–8.3)

## 2012-07-26 LAB — LIPID PANEL
Cholesterol: 207 mg/dL — ABNORMAL HIGH (ref 0–200)
Total CHOL/HDL Ratio: 5

## 2012-07-26 LAB — URINALYSIS, ROUTINE W REFLEX MICROSCOPIC
Hgb urine dipstick: NEGATIVE
Nitrite: NEGATIVE
RBC / HPF: NONE SEEN (ref 0–?)
Specific Gravity, Urine: >=1.03 (ref 1.000–1.030)
Urine Glucose: NEGATIVE

## 2012-07-26 LAB — BASIC METABOLIC PANEL
Calcium: 9.5 mg/dL (ref 8.4–10.5)
Creatinine, Ser: 0.7 mg/dL (ref 0.4–1.2)
GFR: 85.99 mL/min (ref >60.00)
Sodium: 138 mEq/L (ref 135–145)

## 2012-07-26 LAB — LDL CHOLESTEROL, DIRECT: Direct LDL: 123 mg/dL

## 2012-08-04 ENCOUNTER — Encounter: Payer: Self-pay | Admitting: Internal Medicine

## 2012-08-04 ENCOUNTER — Ambulatory Visit (INDEPENDENT_AMBULATORY_CARE_PROVIDER_SITE_OTHER): Payer: Medicare Other | Admitting: Internal Medicine

## 2012-08-04 VITALS — BP 160/78 | HR 73 | Temp 98.3°F | Ht 65.0 in | Wt 192.5 lb

## 2012-08-04 DIAGNOSIS — Z23 Encounter for immunization: Secondary | ICD-10-CM

## 2012-08-04 DIAGNOSIS — Z0000 Encounter for general adult medical examination without abnormal findings: Secondary | ICD-10-CM

## 2012-08-04 DIAGNOSIS — I10 Essential (primary) hypertension: Secondary | ICD-10-CM

## 2012-08-04 MED ORDER — FEXOFENADINE HCL 180 MG PO TABS: ORAL_TABLET | ORAL | Status: DC

## 2012-08-04 MED ORDER — HYDROCHLOROTHIAZIDE 25 MG PO TABS: ORAL_TABLET | ORAL | Status: DC

## 2012-08-04 MED ORDER — POTASSIUM CHLORIDE CRYS ER 10 MEQ PO TBCR: 10.0000 meq | EXTENDED_RELEASE_TABLET | Freq: Every day | ORAL | Status: DC

## 2012-08-04 MED ORDER — POTASSIUM CHLORIDE CRYS ER 10 MEQ PO TBCR
10.0000 meq | EXTENDED_RELEASE_TABLET | Freq: Every day | ORAL | Status: DC
Start: 2012-08-04 — End: 2013-03-10

## 2012-08-04 MED ORDER — LOSARTAN POTASSIUM 50 MG PO TABS: ORAL_TABLET | ORAL | Status: DC

## 2012-08-04 NOTE — Assessment & Plan Note (Signed)

## 2012-08-04 NOTE — Assessment & Plan Note (Signed)
elev today, but < 140/90 per pt on regular basis, ok to cont meds as is,  to f/u any worsening symptoms or concerns

## 2012-08-04 NOTE — Progress Notes (Signed)
Subjective:    Patient ID: Alyssa Ho, female    DOB: [birth year], 67 y.o.   MRN: [id number]  HPI Here for wellness and f/u;  Overall doing ok;  Pt denies CP, worsening SOB, DOE, wheezing, orthopnea, PND, worsening LE edema, palpitations, dizziness or syncope.  Pt denies neurological change such as new headache, facial or extremity weakness.  Pt denies polydipsia, polyuria, or low sugar symptoms. Pt states overall good compliance with treatment and medications, good tolerability, and has been trying to follow lower cholesterol diet.  Pt denies worsening depressive symptoms, suicidal ideation or panic. No fever, night sweats, wt loss, loss of appetite, or other constitutional symptoms.  Pt states good ability with ADL's, has low fall risk, home safety reviewed and adequate, no other significant changes in hearing or vision, and only occasionally active with exercise. Pneumovax inadvertantly not given last visit dec 2012. No acute complaints Past Medical History  Diagnosis Date  . ALLERGIC RHINITIS 12/02/2007  . ANEMIA-IRON DEFICIENCY 12/02/2007  . COLONIC POLYPS, HX OF 12/02/2007  . HYPERTENSION 12/02/2007   Past Surgical History  Procedure Laterality Date  . Abdominal hysterectomy    . Cataract extraction    . Tonsillectomy      reports that she has never smoked. She does not have any smokeless tobacco history on file. She reports that she does not drink alcohol or use illicit drugs. family history includes Alcohol abuse in her father and paternal grandfather; Cancer in her father and mother; Diabetes in her father and paternal uncle; Heart disease in her paternal grandfather; and Stroke in her paternal grandfather. No Known Allergies Current Outpatient Prescriptions on File Prior to Visit  Medication Sig Dispense Refill  . aspirin 81 MG tablet Take 81 mg by mouth daily.         No current facility-administered medications on file prior to visit.   Review of Systems Constitutional:  Negative for diaphoresis, activity change, appetite change or unexpected weight change.  HENT: Negative for hearing loss, ear pain, facial swelling, mouth sores and neck stiffness.   Eyes: Negative for pain, redness and visual disturbance.  Respiratory: Negative for shortness of breath and wheezing.   Cardiovascular: Negative for chest pain and palpitations.  Gastrointestinal: Negative for diarrhea, blood in stool, abdominal distention or other pain Genitourinary: Negative for hematuria, flank pain or change in urine volume.  Musculoskeletal: Negative for myalgias and joint swelling.  Skin: Negative for color change and wound.  Neurological: Negative for syncope and numbness. other than noted Hematological: Negative for adenopathy.  Psychiatric/Behavioral: Negative for hallucinations, self-injury, decreased concentration and agitation.      Objective:   Physical Exam BP 160/78  Pulse 73  Temp(Src) 98.3 F (36.8 C) (Oral)  Ht 5\' 5"  (1.651 m)  Wt 192 lb 8 oz (87.317 kg)  BMI 32.03 kg/m2  SpO2 97% VS noted, obese Constitutional: Pt is oriented to person, place, and time. Appears well-developed and well-nourished.  Head: Normocephalic and atraumatic.  Right Ear: External ear normal.  Left Ear: External ear normal.  Nose: Nose normal.  Mouth/Throat: Oropharynx is clear and moist.  Eyes: Conjunctivae and EOM are normal. Pupils are equal, round, and reactive to light.  Neck: Normal range of motion. Neck supple. No JVD present. No tracheal deviation present.  Cardiovascular: Normal rate, regular rhythm, normal heart sounds and intact distal pulses.   Pulmonary/Chest: Effort normal and breath sounds normal.  Abdominal: Soft. Bowel sounds are normal. There is no tenderness. No HSM  Musculoskeletal: Normal  range of motion. Exhibits no edema.  Lymphadenopathy:  Has no cervical adenopathy.  Neurological: Pt is alert and oriented to person, place, and time. Pt has normal reflexes. No cranial  nerve deficit.  Skin: Skin is warm and dry. No rash noted.  Psychiatric:  Has  normal mood and affect. Behavior is normal.     Assessment & Plan:

## 2012-08-04 NOTE — Patient Instructions (Signed)
You had the pneumonia shot today Please continue all other medications as before, and refills have been done if requested. Please continue your efforts at being more active, low cholesterol diet, and weight control. You are otherwise up to date with prevention measures today.  Please remember to sign up for MyChart if you have not done so, as this will be important to you in the future with finding out test results, communicating by private email, and scheduling acute appointments online when needed.  Please return in 1 year for your yearly visit, or sooner if needed, with Lab testing done 3-5 days before

## 2013-01-12 ENCOUNTER — Ambulatory Visit (INDEPENDENT_AMBULATORY_CARE_PROVIDER_SITE_OTHER): Payer: Medicare Other

## 2013-01-12 DIAGNOSIS — Z23 Encounter for immunization: Secondary | ICD-10-CM

## 2013-03-10 ENCOUNTER — Encounter (HOSPITAL_COMMUNITY): Payer: Self-pay | Admitting: Emergency Medicine

## 2013-03-10 ENCOUNTER — Emergency Department (HOSPITAL_COMMUNITY)
Admission: EM | Admit: 2013-03-10 | Discharge: 2013-03-10 | Disposition: A | Discharge status: Home / Self Care | Payer: Medicare Other | Attending: Emergency Medicine | Admitting: Emergency Medicine

## 2013-03-10 ENCOUNTER — Emergency Department (HOSPITAL_COMMUNITY): Payer: Medicare Other

## 2013-03-10 DIAGNOSIS — Z860100 Personal history of colon polyps, unspecified: Secondary | ICD-10-CM | POA: Insufficient documentation

## 2013-03-10 DIAGNOSIS — R5383 Other fatigue: Secondary | ICD-10-CM

## 2013-03-10 DIAGNOSIS — R55 Syncope and collapse: Secondary | ICD-10-CM

## 2013-03-10 DIAGNOSIS — R5381 Other malaise: Secondary | ICD-10-CM | POA: Insufficient documentation

## 2013-03-10 DIAGNOSIS — Z8709 Personal history of other diseases of the respiratory system: Secondary | ICD-10-CM | POA: Insufficient documentation

## 2013-03-10 DIAGNOSIS — Z79899 Other long term (current) drug therapy: Secondary | ICD-10-CM | POA: Insufficient documentation

## 2013-03-10 DIAGNOSIS — E860 Dehydration: Secondary | ICD-10-CM | POA: Insufficient documentation

## 2013-03-10 DIAGNOSIS — R61 Generalized hyperhidrosis: Secondary | ICD-10-CM | POA: Insufficient documentation

## 2013-03-10 DIAGNOSIS — I10 Essential (primary) hypertension: Secondary | ICD-10-CM | POA: Insufficient documentation

## 2013-03-10 DIAGNOSIS — R110 Nausea: Secondary | ICD-10-CM | POA: Insufficient documentation

## 2013-03-10 DIAGNOSIS — Z7982 Long term (current) use of aspirin: Secondary | ICD-10-CM | POA: Insufficient documentation

## 2013-03-10 DIAGNOSIS — Z862 Personal history of diseases of the blood and blood-forming organs and certain disorders involving the immune mechanism: Secondary | ICD-10-CM | POA: Insufficient documentation

## 2013-03-10 LAB — URINE MICROSCOPIC-ADD ON

## 2013-03-10 LAB — URINALYSIS, ROUTINE W REFLEX MICROSCOPIC
GLUCOSE, UA: NEGATIVE mg/dL
Hgb urine dipstick: NEGATIVE
Ketones, ur: 15 mg/dL — AB
Nitrite: NEGATIVE
PH: 5.5 (ref 5.0–8.0)
Protein, ur: 100 mg/dL — AB
Specific Gravity, Urine: 1.033 — ABNORMAL HIGH (ref 1.005–1.030)
Urobilinogen, UA: 1 mg/dL (ref 0.0–1.0)

## 2013-03-10 LAB — COMPREHENSIVE METABOLIC PANEL
ALBUMIN: 4 g/dL (ref 3.5–5.2)
ALT: 14 U/L (ref 0–35)
AST: 16 U/L (ref 0–37)
Alkaline Phosphatase: 77 U/L (ref 39–117)
BUN: 25 mg/dL — AB (ref 6–23)
CO2: 24 mEq/L (ref 19–32)
Calcium: 9.3 mg/dL (ref 8.4–10.5)
Chloride: 101 mEq/L (ref 96–112)
Creatinine, Ser: 0.72 mg/dL (ref 0.50–1.10)
GFR calc Af Amer: >90 mL/min (ref >90)
GFR calc non Af Amer: 87 mL/min — ABNORMAL LOW (ref >90)
Glucose, Bld: 138 mg/dL — ABNORMAL HIGH (ref 70–99)
Potassium: 3.6 mEq/L — ABNORMAL LOW (ref 3.7–5.3)
Sodium: 141 mEq/L (ref 137–147)
TOTAL PROTEIN: 7.1 g/dL (ref 6.0–8.3)
Total Bilirubin: 0.9 mg/dL (ref 0.3–1.2)

## 2013-03-10 LAB — CBC
HCT: 36 % (ref 36.0–46.0)
Hemoglobin: 12.5 g/dL (ref 12.0–15.0)
MCH: 32 pg (ref 26.0–34.0)
MCHC: 34.7 g/dL (ref 30.0–36.0)
MCV: 92.1 fL (ref 78.0–100.0)
Platelets: 212 10*3/uL (ref 150–400)
RBC: 3.91 MIL/uL (ref 3.87–5.11)
RDW: 12.6 % (ref 11.5–15.5)
WBC: 9.8 10*3/uL (ref 4.0–10.5)

## 2013-03-10 LAB — GLUCOSE, CAPILLARY: Glucose-Capillary: 127 mg/dL — ABNORMAL HIGH (ref 70–99)

## 2013-03-10 MED ORDER — ONDANSETRON 4 MG PO TBDP: 4.0000 mg | ORAL_TABLET | Freq: Three times a day (TID) | ORAL | Status: DC | PRN

## 2013-03-10 MED ORDER — PROMETHAZINE HCL 25 MG/ML IJ SOLN
25.0000 mg | Freq: Once | INTRAMUSCULAR | Status: AC
  Administered 2013-03-10: 25 mg via INTRAVENOUS
  Filled 2013-03-10: qty 1

## 2013-03-10 MED ORDER — SODIUM CHLORIDE 0.9 % IV BOLUS (SEPSIS)
1000.0000 mL | Freq: Once | INTRAVENOUS | Status: AC
  Administered 2013-03-10: 1000 mL via INTRAVENOUS

## 2013-03-10 NOTE — ED Notes (Signed)
Pt given d/c instructions and verbalized understanding.  

## 2013-03-10 NOTE — ED Notes (Signed)
Patient transported to CT 

## 2013-03-10 NOTE — Progress Notes (Signed)
ED CM received a call from [facility] clarification for  discharge prescription for dosage of Zofran. No further ED CM needs identified.

## 2013-03-10 NOTE — ED Notes (Signed)
Per EMS - pt reports "not feeling well" this evening, pt got out of the bed to use the restroom and while ambulating back to bed pt experienced a witness syncopal episode, pt attempted to get up and experienced a second syncopal episode. Pt w/ x1 episode of n/v for EMS and then appeared lethargic s/p vomiting. Pt presently c/o generalized weakness at present. Pt denies any pain present, pt given 4mg  zofran IVP

## 2013-03-10 NOTE — ED Provider Notes (Signed)
CSN: [id number]     Arrival date & time 03/10/13  0150 History   First MD Initiated Contact with Patient 03/10/13 0204     Chief Complaint  Patient presents with  . Loss of Consciousness   (Consider location/radiation/quality/duration/timing/severity/associated sxs/prior Treatment) HPI Comments: 68 year old female, history of hypertension presents after having several syncopal episodes this evening. She has never had anything like this before, she notes that she was nauseated, walk to the bathroom, sat down and syncopized on the commode. She fell to the ground, is unsure how long she was on the ground, got back up and tried to walk out of the bathroom when she syncopized again. She had a prodromal symptom of generalized weakness and nausea. She was him to be pale and diaphoretic by the paramedics, her vital signs normalized very quickly as did her symptoms after receiving Zofran. She denies any chest pain, palpitations, headache, abdominal or back pain, numbness, weakness, blurred vision, slurred speech or any other complaints. The evening prior to these symptoms the patient had been totally normal with normal appetite, normal bowel and bladder function and normal oral intake. She has never had any heart disease, brain disease or lung disease and does not drink alcohol or use drugs or cigarettes. She has not had any new medications, over-the-counter or prescription  Patient is a 68 y.o. female presenting with syncope. The history is provided by the patient, the spouse and the EMS personnel.  Loss of Consciousness   Past Medical History  Diagnosis Date  . ALLERGIC RHINITIS 12/02/2007  . ANEMIA-IRON DEFICIENCY 12/02/2007  . COLONIC POLYPS, HX OF 12/02/2007  . HYPERTENSION 12/02/2007   Past Surgical History  Procedure Laterality Date  . Abdominal hysterectomy    . Cataract extraction    . Tonsillectomy     Family History  Problem Relation Age of Onset  . Cancer Mother     colon  . Cancer  Father     colon  . Alcohol abuse Father   . Diabetes Father   . Diabetes Paternal Uncle   . Alcohol abuse Paternal Grandfather   . Heart disease Paternal Grandfather   . Stroke Paternal Grandfather    History  Substance Use Topics  . Smoking status: Never Smoker   . Smokeless tobacco: Not on file  . Alcohol Use: No   OB History   Grav Para Term Preterm Abortions TAB SAB Ect Mult Living                 Review of Systems  Cardiovascular: Positive for syncope.  All other systems reviewed and are negative.    Allergies  Review of patient's allergies indicates no known allergies.  Home Medications   Current Outpatient Rx  Name  Route  Sig  Dispense  Refill  . aspirin 81 MG tablet   Oral   Take 81 mg by mouth daily.           . fexofenadine (ALLEGRA) 180 MG tablet   Oral   Take 180 mg by mouth daily as needed for allergies or rhinitis.         . hydrochlorothiazide (HYDRODIURIL) 25 MG tablet   Oral   Take 25 mg by mouth daily.         Marland Kitchen ibuprofen (ADVIL,MOTRIN) 200 MG tablet   Oral   Take 400 mg by mouth every 6 (six) hours as needed for headache.         . losartan (COZAAR) 50 MG tablet  Oral   Take 50 mg by mouth daily.         . Multiple Vitamins-Minerals (ADEKS) chewable tablet   Oral   Chew 1 tablet by mouth daily.         . ondansetron (ZOFRAN ODT) 4 MG disintegrating tablet   Oral   Take 1 tablet (4 mg total) by mouth every 8 (eight) hours as needed for nausea.   10 tablet   0    BP 107/52  Pulse 75  Temp(Src) 97.7 F (36.5 C) (Oral)  Resp 19  Ht 5\' 5"  (1.651 m)  SpO2 94% Physical Exam  Nursing note and vitals reviewed. Constitutional: She appears well-developed and well-nourished. No distress.  HENT:  Head: Normocephalic and atraumatic.  Mouth/Throat: Oropharynx is clear and moist. No oropharyngeal exudate.   No hemotympanum, no malocclusion, no battle sign, mild abrasions to the nasal bridge and the right forehead. No  tenderness over the orbital rim, no nasal bridge tenderness  Eyes: Conjunctivae and EOM are normal. Pupils are equal, round, and reactive to light. Right eye exhibits no discharge. Left eye exhibits no discharge. No scleral icterus.  Neck: Normal range of motion. Neck supple. No JVD present. No thyromegaly present.  Cardiovascular: Normal rate, regular rhythm, normal heart sounds and intact distal pulses.  Exam reveals no gallop and no friction rub.   No murmur heard. No carotid bruit  Pulmonary/Chest: Effort normal and breath sounds normal. No respiratory distress. She has no wheezes. She has no rales.  Abdominal: Soft. Bowel sounds are normal. She exhibits no distension and no mass. There is no tenderness.  Musculoskeletal: Normal range of motion. She exhibits no edema and no tenderness.  Lymphadenopathy:    She has no cervical adenopathy.  Neurological: She is alert. Coordination normal.  Normal speech, coordination, strength, sensation, cranial nerves III through XII intact  Skin: Skin is warm and dry. No rash noted. No erythema.  Psychiatric: She has a normal mood and affect. Her behavior is normal.    ED Course  Procedures (including critical care time) Labs Review Labs Reviewed  COMPREHENSIVE METABOLIC PANEL - Abnormal; Notable for the following:    Potassium 3.6 (*)    Glucose, Bld 138 (*)    BUN 25 (*)    GFR calc non Af Amer 87 (*)    All other components within normal limits  URINALYSIS, ROUTINE W REFLEX MICROSCOPIC - Abnormal; Notable for the following:    Color, Urine ORANGE (*)    APPearance CLOUDY (*)    Specific Gravity, Urine 1.033 (*)    Bilirubin Urine SMALL (*)    Ketones, ur 15 (*)    Protein, ur 100 (*)    Leukocytes, UA SMALL (*)    All other components within normal limits  GLUCOSE, CAPILLARY - Abnormal; Notable for the following:    Glucose-Capillary 127 (*)    All other components within normal limits  URINE MICROSCOPIC-ADD ON - Abnormal; Notable for  the following:    Casts HYALINE CASTS (*)    All other components within normal limits  CBC   Imaging Review Ct Head Wo Contrast  03/10/2013   CLINICAL DATA:  Severe headache  EXAM: CT HEAD WITHOUT CONTRAST  TECHNIQUE: Contiguous axial images were obtained from the base of the skull through the vertex without intravenous contrast.  COMPARISON:  None.  FINDINGS: Mild diffuse prominence of the CSF containing spaces is compatible with mild generalized age-related atrophy. There is no acute intracranial hemorrhage or  infarct. No mass lesion or midline shift. Gray-white matter differentiation is well maintained. Ventricles are normal in size without evidence of hydrocephalus. CSF containing spaces are within normal limits. No extra-axial fluid collection.  The calvarium is intact.  Orbital soft tissues are within normal limits.  Probable retention cyst noted within the right maxillary sinus. Paranasal sinuses are otherwise clear. No mastoid effusion.  Scalp soft tissues are unremarkable.  IMPRESSION: 1. No acute intracranial abnormality. 2. 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   Electronically Signed   By: Jeannine Boga M.D.   On: 03/10/2013 03:17    EKG Interpretation   None       MDM   1. Syncope   2. Dehydration    The patient is well-appearing, she has an EKG which shows no acute ischemia, no arrhythmia, her exam is reassuring, there is minimal trauma to the head, no signs of significant brain injury. Will initiate workup to rule out anemia, elect to light disturbance, cardiac monitoring to check for arrhythmia. She did have a slight change in her pulse with standing and orthostatic change. No blood pressure drop. Appears to be vagal episode on history and exam.  ED ECG REPORT  I personally interpreted this EKG   Date: 03/10/2013   Rate: 75  Rhythm: normal sinus rhythm  QRS Axis: normal  Intervals: PR shortened  ST/T Wave abnormalities: normal  Conduction Disutrbances:none   Narrative Interpretation: PR interval short in 2009 ECG as well  Old EKG Reviewed: none available  CT scan negative, orthostatic vital signs with change in heart rate but no significant change in blood pressure, labs are overall unremarkable, the patient was informed of all of these findings, she has improved significantly after medications and fluids and is ambulatory without difficulty, likely vagal response, stable for discharge.   Meds given in ED:  Medications  sodium chloride 0.9 % bolus 1,000 mL (1,000 mLs Intravenous New Bag/Given 03/10/13 0455)  promethazine (PHENERGAN) injection 25 mg (25 mg Intravenous Given 03/10/13 0456)    New Prescriptions   ONDANSETRON (ZOFRAN ODT) 4 MG DISINTEGRATING TABLET    Take 1 tablet (4 mg total) by mouth every 8 (eight) hours as needed for nausea.      Johnna Acosta, MD 03/10/13 [phone]

## 2013-03-10 NOTE — Discharge Instructions (Signed)
Your testing shows that you are mildly dehydrated, otherwise your testing is normal.  Return to the ER if you should develop severe or worsening symptoms.  Please call your doctor for a followup appointment within 24-48 hours. When you talk to your doctor please let them know that you were seen in the emergency department and have them acquire all of your records so that they can discuss the findings with you and formulate a treatment plan to fully care for your new and ongoing problems.  zofran for nausea

## 2013-08-02 ENCOUNTER — Other Ambulatory Visit: Payer: Self-pay | Admitting: Internal Medicine

## 2013-10-28 ENCOUNTER — Other Ambulatory Visit: Payer: Self-pay | Admitting: Internal Medicine

## 2013-11-01 ENCOUNTER — Ambulatory Visit (INDEPENDENT_AMBULATORY_CARE_PROVIDER_SITE_OTHER): Payer: Medicare Other

## 2013-11-01 ENCOUNTER — Ambulatory Visit (INDEPENDENT_AMBULATORY_CARE_PROVIDER_SITE_OTHER): Payer: Medicare Other | Admitting: Internal Medicine

## 2013-11-01 ENCOUNTER — Encounter: Payer: Self-pay | Admitting: Internal Medicine

## 2013-11-01 VITALS — BP 132/72 | HR 86 | Temp 98.6°F | Wt 180.4 lb

## 2013-11-01 DIAGNOSIS — Z23 Encounter for immunization: Secondary | ICD-10-CM

## 2013-11-01 DIAGNOSIS — Z0000 Encounter for general adult medical examination without abnormal findings: Secondary | ICD-10-CM

## 2013-11-01 DIAGNOSIS — I10 Essential (primary) hypertension: Secondary | ICD-10-CM

## 2013-11-01 LAB — CBC WITH DIFFERENTIAL/PLATELET
Basophils Absolute: 0 10*3/uL (ref 0.0–0.1)
Basophils Relative: 0.5 % (ref 0.0–3.0)
Eosinophils Absolute: 0.1 10*3/uL (ref 0.0–0.7)
Eosinophils Relative: 1.4 % (ref 0.0–5.0)
HCT: 37.9 % (ref 36.0–46.0)
Hemoglobin: 12.9 g/dL (ref 12.0–15.0)
Lymphocytes Relative: 23.9 % (ref 12.0–46.0)
Lymphs Abs: 1.8 10*3/uL (ref 0.7–4.0)
MCHC: 34.1 g/dL (ref 30.0–36.0)
MCV: 93.1 fl (ref 78.0–100.0)
Monocytes Absolute: 0.6 10*3/uL (ref 0.1–1.0)
Monocytes Relative: 8.5 % (ref 3.0–12.0)
Neutro Abs: 4.9 10*3/uL (ref 1.4–7.7)
Neutrophils Relative %: 65.7 % (ref 43.0–77.0)
Platelets: 244 10*3/uL (ref 150.0–400.0)
RBC: 4.07 Mil/uL (ref 3.87–5.11)
RDW: 13.3 % (ref 11.5–15.5)
WBC: 7.5 10*3/uL (ref 4.0–10.5)

## 2013-11-01 LAB — URINALYSIS, ROUTINE W REFLEX MICROSCOPIC
Bilirubin Urine: NEGATIVE
Hgb urine dipstick: NEGATIVE
Leukocytes, UA: NEGATIVE
Nitrite: NEGATIVE
Specific Gravity, Urine: 1.015 (ref 1.000–1.030)
Total Protein, Urine: NEGATIVE
Urine Glucose: NEGATIVE
Urobilinogen, UA: 0.2 (ref 0.0–1.0)
pH: 6 (ref 5.0–8.0)

## 2013-11-01 LAB — BASIC METABOLIC PANEL
BUN: 17 mg/dL (ref 6–23)
CO2: 25 mEq/L (ref 19–32)
Calcium: 9.8 mg/dL (ref 8.4–10.5)
Chloride: 104 mEq/L (ref 96–112)
Creatinine, Ser: 0.7 mg/dL (ref 0.4–1.2)
GFR: 93.08 mL/min (ref >60.00)
GLUCOSE: 102 mg/dL — AB (ref 70–99)
POTASSIUM: 3.5 meq/L (ref 3.5–5.1)
Sodium: 139 mEq/L (ref 135–145)

## 2013-11-01 LAB — HEPATIC FUNCTION PANEL
ALT: 23 U/L (ref 0–35)
AST: 27 U/L (ref 0–37)
Albumin: 4.3 g/dL (ref 3.5–5.2)
Alkaline Phosphatase: 86 U/L (ref 39–117)
Bilirubin, Direct: 0.1 mg/dL (ref 0.0–0.3)
Total Bilirubin: 0.9 mg/dL (ref 0.2–1.2)
Total Protein: 7.7 g/dL (ref 6.0–8.3)

## 2013-11-01 LAB — LIPID PANEL
CHOL/HDL RATIO: 5
Cholesterol: 223 mg/dL — ABNORMAL HIGH (ref 0–200)
HDL: 46.5 mg/dL (ref >39.00)
NonHDL: 176.5
Triglycerides: 267 mg/dL — ABNORMAL HIGH (ref 0.0–149.0)
VLDL: 53.4 mg/dL — AB (ref 0.0–40.0)

## 2013-11-01 LAB — TSH: TSH: 0.53 u[IU]/mL (ref 0.35–4.50)

## 2013-11-01 MED ORDER — HYDROCHLOROTHIAZIDE 25 MG PO TABS: 25.0000 mg | ORAL_TABLET | Freq: Every day | ORAL | Status: DC

## 2013-11-01 MED ORDER — FEXOFENADINE HCL 180 MG PO TABS: 180.0000 mg | ORAL_TABLET | Freq: Every day | ORAL | Status: DC | PRN

## 2013-11-01 MED ORDER — LOSARTAN POTASSIUM 50 MG PO TABS: 50.0000 mg | ORAL_TABLET | Freq: Every day | ORAL | Status: DC

## 2013-11-01 NOTE — Progress Notes (Signed)
Subjective:    Patient ID: Alyssa Ho, female    DOB: 01-Mar-1945, 68 y.o.   MRN: [id number]  HPI  Here for wellness and f/u;  Overall doing ok;  Pt denies CP, worsening SOB, DOE, wheezing, orthopnea, PND, worsening LE edema, palpitations, dizziness or syncope.  Pt denies neurological change such as new headache, facial or extremity weakness.  Pt denies polydipsia, polyuria, or low sugar symptoms. Pt states overall good compliance with treatment and medications, good tolerability, and has been trying to follow lower cholesterol diet.  Pt denies worsening depressive symptoms, suicidal ideation or panic. No fever, night sweats, wt loss, loss of appetite, or other constitutional symptoms.  Pt states good ability with ADL's, has low fall risk, home safety reviewed and adequate, no other significant changes in hearing or vision, and only occasionally active with exercise. Wearing the fitbit, gets her 10K steps today.  No current complaints. Did have vagal episode with falling out in jan 2015 with dehydration, no c/o's since then Past Medical History  Diagnosis Date  . ALLERGIC RHINITIS 12/02/2007  . ANEMIA-IRON DEFICIENCY 12/02/2007  . COLONIC POLYPS, HX OF 12/02/2007  . HYPERTENSION 12/02/2007   Past Surgical History  Procedure Laterality Date  . Abdominal hysterectomy    . Cataract extraction    . Tonsillectomy      reports that she has never smoked. She does not have any smokeless tobacco history on file. She reports that she does not drink alcohol or use illicit drugs. family history includes Alcohol abuse in her father and paternal grandfather; Cancer in her father and mother; Diabetes in her father and paternal uncle; Heart disease in her paternal grandfather; Stroke in her paternal grandfather. No Known Allergies Current Outpatient Prescriptions on File Prior to Visit  Medication Sig Dispense Refill  . aspirin 81 MG tablet Take 81 mg by mouth daily.        Marland Kitchen ibuprofen (ADVIL,MOTRIN) 200 MG  tablet Take 400 mg by mouth every 6 (six) hours as needed for headache.      . Multiple Vitamins-Minerals (ADEKS) chewable tablet Chew 1 tablet by mouth daily.      . ondansetron (ZOFRAN ODT) 4 MG disintegrating tablet Take 1 tablet (4 mg total) by mouth every 8 (eight) hours as needed for nausea.  10 tablet  0   No current facility-administered medications on file prior to visit.   Review of Systems Constitutional: Negative for increased diaphoresis, other activity, appetite or other siginficant weight change  HENT: Negative for worsening hearing loss, ear pain, facial swelling, mouth sores and neck stiffness.   Eyes: Negative for other worsening pain, redness or visual disturbance.  Respiratory: Negative for shortness of breath and wheezing.   Cardiovascular: Negative for chest pain and palpitations.  Gastrointestinal: Negative for diarrhea, blood in stool, abdominal distention or other pain Genitourinary: Negative for hematuria, flank pain or change in urine volume.  Musculoskeletal: Negative for myalgias or other joint complaints.  Skin: Negative for color change and wound.  Neurological: Negative for syncope and numbness. other than noted Hematological: Negative for adenopathy. or other swelling Psychiatric/Behavioral: Negative for hallucinations, self-injury, decreased concentration or other worsening agitation.      Objective:   Physical Exam BP 132/72  Pulse 86  Temp(Src) 98.6 F (37 C) (Oral)  Wt 180 lb 6 oz (81.818 kg)  SpO2 96% VS noted,  Constitutional: Pt is oriented to person, place, and time. Appears well-developed and well-nourished.  Head: Normocephalic and atraumatic.  Right Ear:  External ear normal.  Left Ear: External ear normal.  Nose: Nose normal.  Mouth/Throat: Oropharynx is clear and moist.  Eyes: Conjunctivae and EOM are normal. Pupils are equal, round, and reactive to light.  Neck: Normal range of motion. Neck supple. No JVD present. No tracheal  deviation present.  Cardiovascular: Normal rate, regular rhythm, normal heart sounds and intact distal pulses.   Pulmonary/Chest: Effort normal and breath sounds without rales or wheezing  Abdominal: Soft. Bowel sounds are normal. NT. No HSM  Musculoskeletal: Normal range of motion. Exhibits no edema.  Lymphadenopathy:  Has no cervical adenopathy.  Neurological: Pt is alert and oriented to person, place, and time. Pt has normal reflexes. No cranial nerve deficit. Motor grossly intact Skin: Skin is warm and dry. No rash noted.  Psychiatric:  Has normal mood and affect. Behavior is normal.     Assessment & Plan:

## 2013-11-01 NOTE — Progress Notes (Signed)
Pre visit review using our clinic review tool, if applicable. No additional management support is needed unless otherwise documented below in the visit note. 

## 2013-11-01 NOTE — Assessment & Plan Note (Signed)
Overall doing well, age appropriate education and counseling updated, referrals for preventative services and immunizations addressed, dietary and smoking counseling addressed, most recent labs reviewed.  I have personally reviewed and have noted: 1) the patient's medical and social history 2) The pt's use of alcohol, tobacco, and illicit drugs 3) The patient's current medications and supplements 4) Functional ability including ADL's, fall risk, home safety risk, hearing and visual impairment 5) Diet and physical activities 6) Evidence for depression or mood disorder 7) The patient's height, weight, and BMI have been recorded in the chart I have made referrals, and provided counseling and education based on review of the above For labs today, flu shot, and prevnar in 2 wks

## 2013-11-01 NOTE — Patient Instructions (Addendum)
You had the flu shot today  Please return in 2 weeks for the new Prevnar Pneumonia shot (with a Nurse Visit)  You will be contacted regarding the referral for: mammogram  Please continue all other medications as before, and refills have been done if requested.  Please have the pharmacy call with any other refills you may need.  Please continue your efforts at being more active, low cholesterol diet, and weight control.  You are otherwise up to date with prevention measures today.  Please keep your appointments with your specialists as you may have planned  Please go to the LAB in the Basement (turn left off the elevator) for the tests to be done today  You will be contacted by phone if any changes need to be made immediately.  Otherwise, you will receive a letter about your results with an explanation, but please check with MyChart first.  Please remember to sign up for MyChart if you have not done so, as this will be important to you in the future with finding out test results, communicating by private email, and scheduling acute appointments online when needed.  Please return in 1 year for your yearly visit, or sooner if needed, with Lab testing done 3-5 days before

## 2013-11-02 ENCOUNTER — Other Ambulatory Visit: Payer: Self-pay | Admitting: Internal Medicine

## 2013-11-02 DIAGNOSIS — Z1231 Encounter for screening mammogram for malignant neoplasm of breast: Secondary | ICD-10-CM

## 2013-11-02 LAB — LDL CHOLESTEROL, DIRECT: LDL DIRECT: 153.4 mg/dL

## 2013-11-16 ENCOUNTER — Ambulatory Visit: Payer: Medicare Other

## 2014-07-06 DIAGNOSIS — I87393 Chronic venous hypertension (idiopathic) with other complications of bilateral lower extremity: Secondary | ICD-10-CM | POA: Diagnosis not present

## 2014-08-21 ENCOUNTER — Other Ambulatory Visit: Payer: Self-pay

## 2014-10-05 IMAGING — CT CT HEAD W/O CM
2 series · 16 of 30 positions shown, 18 images · non-contrast
Comparison: None.

CLINICAL DATA: Severe headache

EXAM:
CT HEAD WITHOUT CONTRAST
TECHNIQUE: Contiguous axial images were obtained from the base of the skull
through the vertex without intravenous contrast.

[Series 2: head w/o · axial · non-contrast · 0.49mm/px · z∈[+79,+209]mm · 8 of 34 slices shown, 10 images]
[im 4/34  brain]
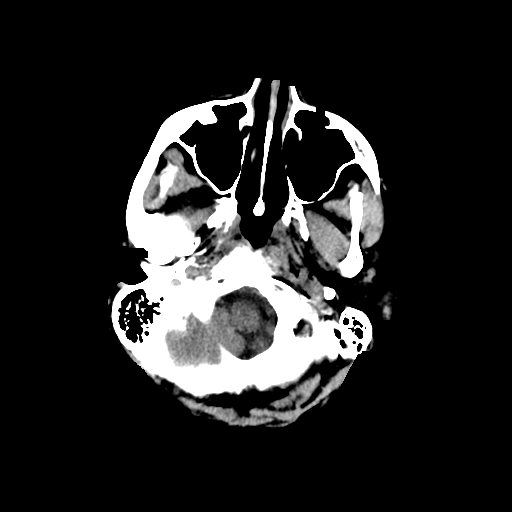
[im 4/34  bone]
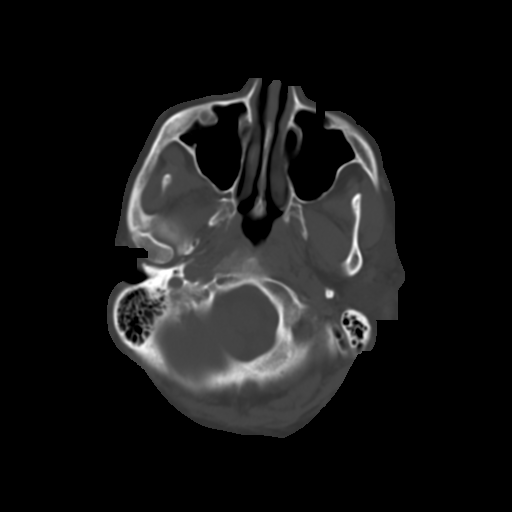
[im 8/34  brain]
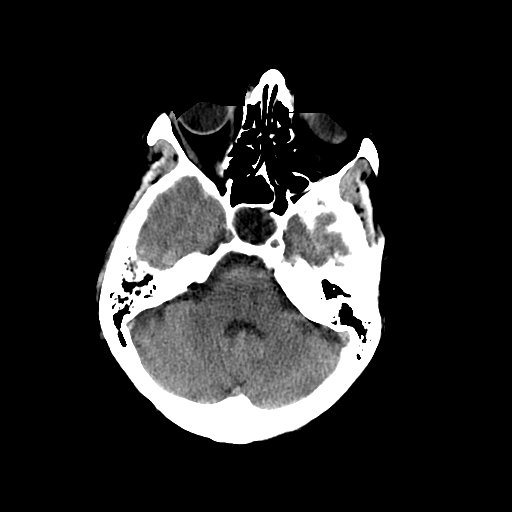
[im 12/34  brain]
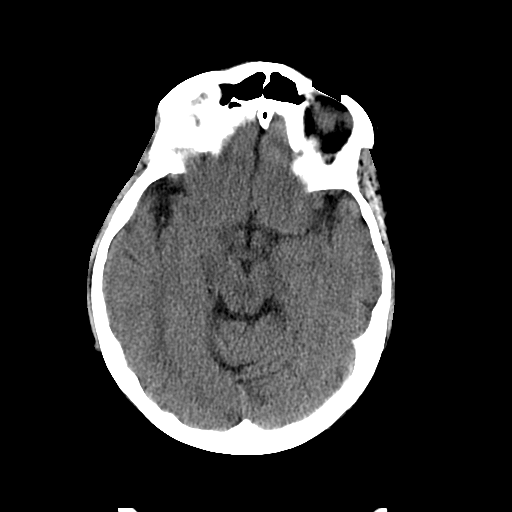
[im 15/34  brain]
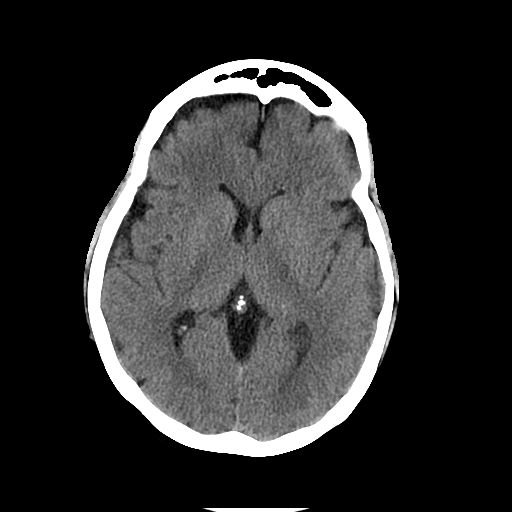
[im 19/34  brain]
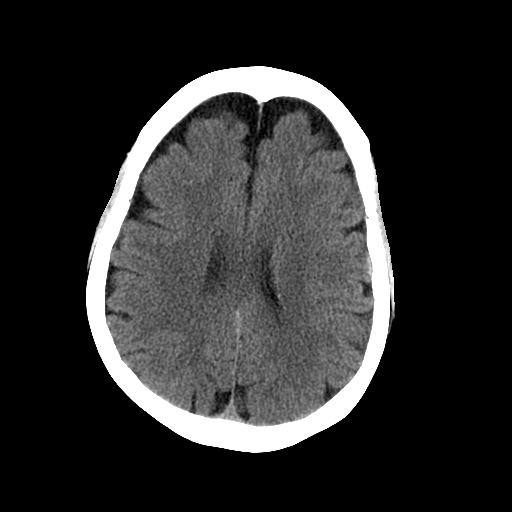
[im 19/34  bone]
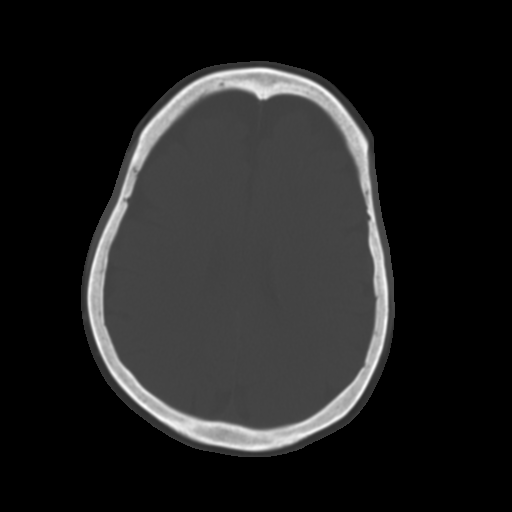
[im 23/34  brain]
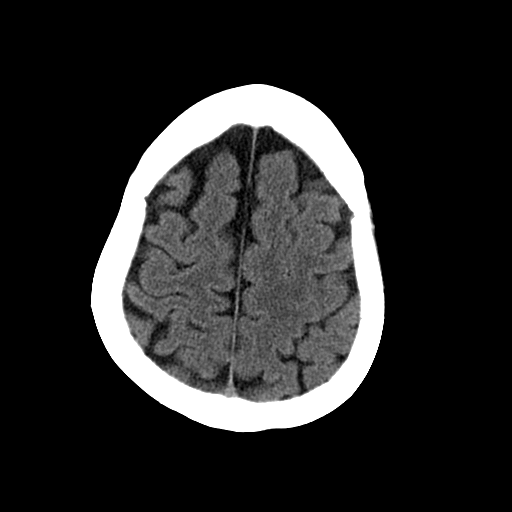
[im 26/34  brain]
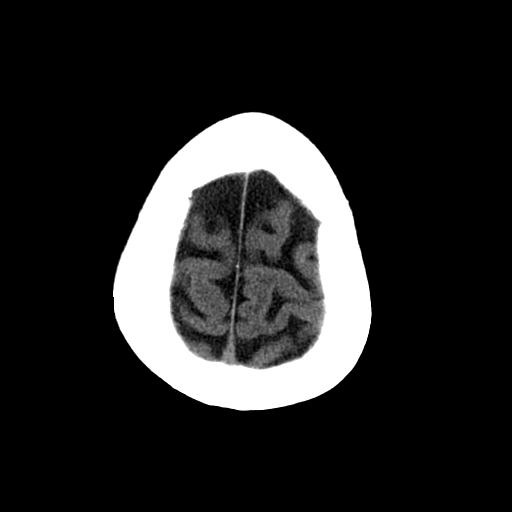
[im 30/34  brain]
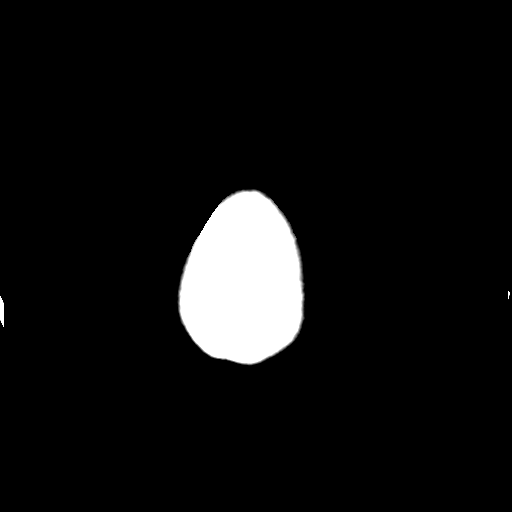

[Series 3: head w/o bone · axial · non-contrast · 0.49mm/px · z∈[+79,+212]mm · 8 of 67 slices shown]
[im 7/67  bone]
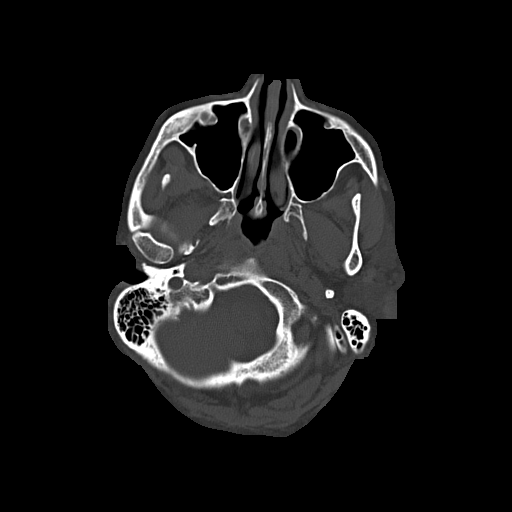
[im 14/67  bone]
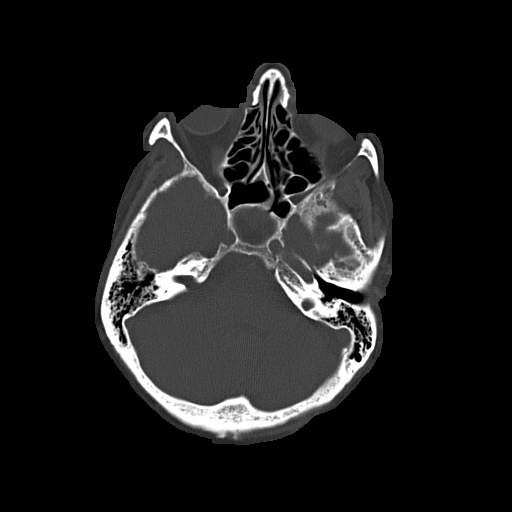
[im 21/67  bone]
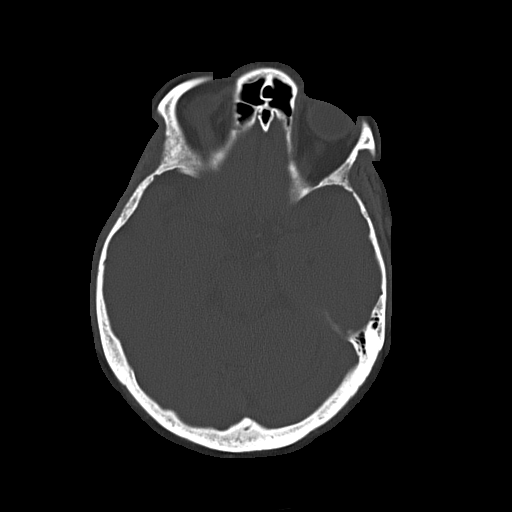
[im 28/67  bone]
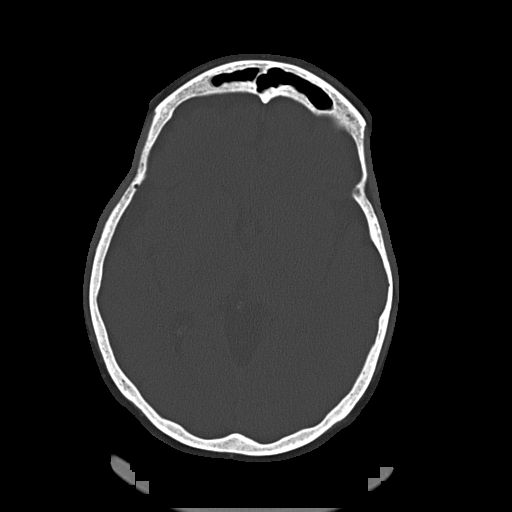
[im 39/67  bone]
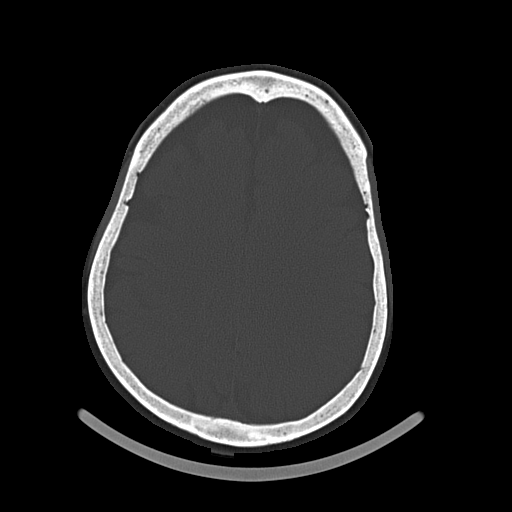
[im 46/67  bone]
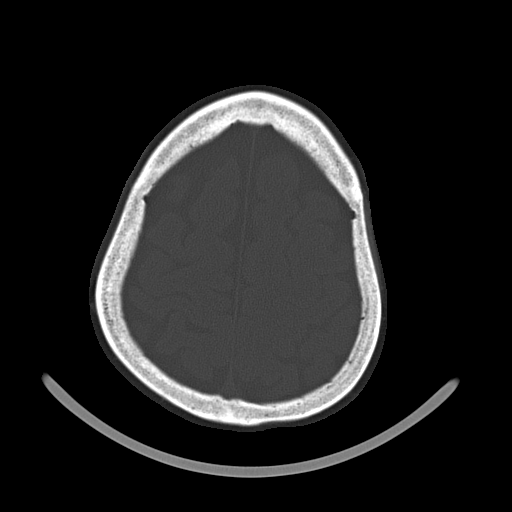
[im 53/67  bone]
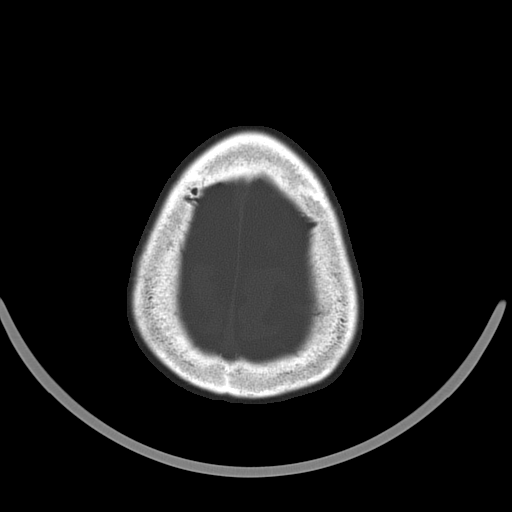
[im 60/67  bone]
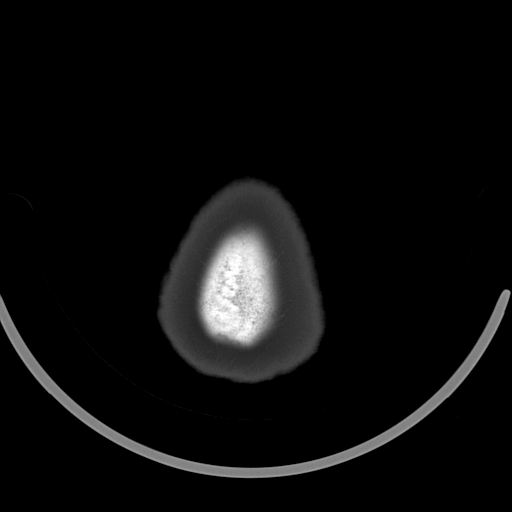

[16 of 30 positions shown; findings below may reference images not displayed]

FINDINGS: Mild diffuse prominence of the CSF containing spaces is compatible
with mild generalized age-related atrophy. There is no acute
intracranial hemorrhage or infarct. No mass lesion or midline shift.
Gray-white matter differentiation is well maintained. Ventricles are
normal in size without evidence of hydrocephalus. CSF containing
spaces are within normal limits. No extra-axial fluid collection.

The calvarium is intact.

Orbital soft tissues are within normal limits.

Probable retention cyst noted within the right maxillary sinus.
Paranasal sinuses are otherwise clear. No mastoid effusion.

Scalp soft tissues are unremarkable.
IMPRESSION: 1. No acute intracranial abnormality.
2. Mild age-related cerebral atrophy.

## 2014-10-21 ENCOUNTER — Other Ambulatory Visit: Payer: Self-pay | Admitting: Internal Medicine

## 2014-11-08 ENCOUNTER — Ambulatory Visit: Payer: Medicare Other | Admitting: Internal Medicine

## 2014-12-18 DIAGNOSIS — H52203 Unspecified astigmatism, bilateral: Secondary | ICD-10-CM | POA: Diagnosis not present

## 2014-12-18 DIAGNOSIS — H26493 Other secondary cataract, bilateral: Secondary | ICD-10-CM | POA: Diagnosis not present

## 2014-12-18 DIAGNOSIS — D3132 Benign neoplasm of left choroid: Secondary | ICD-10-CM | POA: Diagnosis not present

## 2015-01-04 ENCOUNTER — Ambulatory Visit (INDEPENDENT_AMBULATORY_CARE_PROVIDER_SITE_OTHER): Payer: Medicare Other

## 2015-01-04 DIAGNOSIS — Z23 Encounter for immunization: Secondary | ICD-10-CM

## 2015-01-23 ENCOUNTER — Other Ambulatory Visit: Payer: Self-pay | Admitting: Internal Medicine

## 2015-01-24 ENCOUNTER — Telehealth: Payer: Self-pay | Admitting: Internal Medicine

## 2015-01-24 MED ORDER — LOSARTAN POTASSIUM 50 MG PO TABS: ORAL_TABLET | ORAL | Status: DC

## 2015-01-24 MED ORDER — HYDROCHLOROTHIAZIDE 25 MG PO TABS: ORAL_TABLET | ORAL | Status: DC

## 2015-01-24 NOTE — Telephone Encounter (Signed)
Patient called re her losartan and hctz. The request for fill was denied. She has an appt on 02/14/2015, and asks if we can send a temp script in to last until that day.

## 2015-02-14 ENCOUNTER — Encounter: Payer: Self-pay | Admitting: Internal Medicine

## 2015-02-14 ENCOUNTER — Ambulatory Visit (INDEPENDENT_AMBULATORY_CARE_PROVIDER_SITE_OTHER): Payer: Medicare Other | Admitting: Internal Medicine

## 2015-02-14 ENCOUNTER — Other Ambulatory Visit (INDEPENDENT_AMBULATORY_CARE_PROVIDER_SITE_OTHER): Payer: Medicare Other

## 2015-02-14 VITALS — BP 128/72 | HR 82 | Temp 98.4°F | Ht 65.0 in | Wt 190.0 lb

## 2015-02-14 DIAGNOSIS — R739 Hyperglycemia, unspecified: Secondary | ICD-10-CM | POA: Insufficient documentation

## 2015-02-14 DIAGNOSIS — Z0000 Encounter for general adult medical examination without abnormal findings: Secondary | ICD-10-CM | POA: Diagnosis not present

## 2015-02-14 DIAGNOSIS — R7989 Other specified abnormal findings of blood chemistry: Secondary | ICD-10-CM | POA: Diagnosis not present

## 2015-02-14 DIAGNOSIS — I10 Essential (primary) hypertension: Secondary | ICD-10-CM

## 2015-02-14 DIAGNOSIS — Z23 Encounter for immunization: Secondary | ICD-10-CM

## 2015-02-14 DIAGNOSIS — E785 Hyperlipidemia, unspecified: Secondary | ICD-10-CM

## 2015-02-14 LAB — CBC WITH DIFFERENTIAL/PLATELET
BASOS PCT: 0.6 % (ref 0.0–3.0)
Basophils Absolute: 0 10*3/uL (ref 0.0–0.1)
EOS PCT: 1.5 % (ref 0.0–5.0)
Eosinophils Absolute: 0.1 10*3/uL (ref 0.0–0.7)
HCT: 38.5 % (ref 36.0–46.0)
HEMOGLOBIN: 13.1 g/dL (ref 12.0–15.0)
LYMPHS ABS: 1.8 10*3/uL (ref 0.7–4.0)
Lymphocytes Relative: 25.4 % (ref 12.0–46.0)
MCHC: 34 g/dL (ref 30.0–36.0)
MCV: 93.1 fl (ref 78.0–100.0)
MONO ABS: 0.6 10*3/uL (ref 0.1–1.0)
Monocytes Relative: 8 % (ref 3.0–12.0)
NEUTROS PCT: 64.5 % (ref 43.0–77.0)
Neutro Abs: 4.5 10*3/uL (ref 1.4–7.7)
Platelets: 245 10*3/uL (ref 150.0–400.0)
RBC: 4.13 Mil/uL (ref 3.87–5.11)
RDW: 12.9 % (ref 11.5–15.5)
WBC: 7 10*3/uL (ref 4.0–10.5)

## 2015-02-14 LAB — URINALYSIS, ROUTINE W REFLEX MICROSCOPIC
Bilirubin Urine: NEGATIVE
Hgb urine dipstick: NEGATIVE
Ketones, ur: NEGATIVE
Nitrite: NEGATIVE
RBC / HPF: NONE SEEN (ref 0–?)
SPECIFIC GRAVITY, URINE: 1.015 (ref 1.000–1.030)
Total Protein, Urine: NEGATIVE
Urine Glucose: NEGATIVE
Urobilinogen, UA: 0.2 (ref 0.0–1.0)
pH: 6 (ref 5.0–8.0)

## 2015-02-14 LAB — BASIC METABOLIC PANEL
BUN: 14 mg/dL (ref 6–23)
CO2: 28 mEq/L (ref 19–32)
Calcium: 10.1 mg/dL (ref 8.4–10.5)
Chloride: 104 mEq/L (ref 96–112)
Creatinine, Ser: 0.69 mg/dL (ref 0.40–1.20)
GFR: 89.63 mL/min (ref >60.00)
Glucose, Bld: 114 mg/dL — ABNORMAL HIGH (ref 70–99)
POTASSIUM: 3.7 meq/L (ref 3.5–5.1)
SODIUM: 140 meq/L (ref 135–145)

## 2015-02-14 LAB — LIPID PANEL
CHOL/HDL RATIO: 5
Cholesterol: 207 mg/dL — ABNORMAL HIGH (ref 0–200)
HDL: 45.5 mg/dL (ref >39.00)
NonHDL: 161.38
Triglycerides: 297 mg/dL — ABNORMAL HIGH (ref 0.0–149.0)
VLDL: 59.4 mg/dL — AB (ref 0.0–40.0)

## 2015-02-14 LAB — HEPATIC FUNCTION PANEL
ALK PHOS: 78 U/L (ref 39–117)
ALT: 18 U/L (ref 0–35)
AST: 19 U/L (ref 0–37)
Albumin: 4.5 g/dL (ref 3.5–5.2)
Bilirubin, Direct: 0.1 mg/dL (ref 0.0–0.3)
Total Bilirubin: 0.8 mg/dL (ref 0.2–1.2)
Total Protein: 7.7 g/dL (ref 6.0–8.3)

## 2015-02-14 LAB — HEMOGLOBIN A1C: Hgb A1c MFr Bld: 5.5 % (ref 4.6–6.5)

## 2015-02-14 LAB — TSH: TSH: 0.64 u[IU]/mL (ref 0.35–4.50)

## 2015-02-14 LAB — LDL CHOLESTEROL, DIRECT: Direct LDL: 122 mg/dL

## 2015-02-14 MED ORDER — LOSARTAN POTASSIUM 50 MG PO TABS: ORAL_TABLET | ORAL | Status: DC

## 2015-02-14 MED ORDER — HYDROCHLOROTHIAZIDE 25 MG PO TABS: ORAL_TABLET | ORAL | Status: DC

## 2015-02-14 MED ORDER — FEXOFENADINE HCL 180 MG PO TABS: 180.0000 mg | ORAL_TABLET | Freq: Every day | ORAL | Status: DC | PRN

## 2015-02-14 NOTE — Progress Notes (Signed)
Subjective:    Patient ID: Alyssa Ho, female    DOB: 08-20-1945, 69 y.o.   MRN: [id number]  HPI  Here for wellness and f/u;  Overall doing ok;  Pt denies Chest pain, worsening SOB, DOE, wheezing, orthopnea, PND, worsening LE edema, palpitations, dizziness or syncope.  Pt denies neurological change such as new headache, facial or extremity weakness.  Pt denies polydipsia, polyuria, or low sugar symptoms. Pt states overall good compliance with treatment and medications, good tolerability, and has been trying to follow appropriate diet.  Pt denies worsening depressive symptoms, suicidal ideation or panic. No fever, night sweats, wt loss, loss of appetite, or other constitutional symptoms.  Pt states good ability with ADL's, has low fall risk, home safety reviewed and adequate, no other significant changes in hearing or vision, and only occasionally active with exercise. Plans to do more, and work on lower chol diet and wt loss Past Medical History  Diagnosis Date  . ALLERGIC RHINITIS 12/02/2007  . ANEMIA-IRON DEFICIENCY 12/02/2007  . COLONIC POLYPS, HX OF 12/02/2007  . HYPERTENSION 12/02/2007   Past Surgical History  Procedure Laterality Date  . Abdominal hysterectomy    . Cataract extraction    . Tonsillectomy      reports that she has never smoked. She does not have any smokeless tobacco history on file. She reports that she does not drink alcohol or use illicit drugs. family history includes Alcohol abuse in her father and paternal grandfather; Cancer in her father and mother; Diabetes in her father and paternal uncle; Heart disease in her paternal grandfather; Stroke in her paternal grandfather. No Known Allergies Current Outpatient Prescriptions on File Prior to Visit  Medication Sig Dispense Refill  . aspirin 81 MG tablet Take 81 mg by mouth daily.      . Multiple Vitamins-Minerals (ADEKS) chewable tablet Chew 1 tablet by mouth daily.    Marland Kitchen ibuprofen (ADVIL,MOTRIN) 200 MG tablet Take  400 mg by mouth every 6 (six) hours as needed for headache. Reported on 02/14/2015    . ondansetron (ZOFRAN ODT) 4 MG disintegrating tablet Take 1 tablet (4 mg total) by mouth every 8 (eight) hours as needed for nausea. (Patient not taking: Reported on 02/14/2015) 10 tablet 0   No current facility-administered medications on file prior to visit.   Review of Systems Constitutional: Negative for increased diaphoresis, other activity, appetite or siginficant weight change other than noted HENT: Negative for worsening hearing loss, ear pain, facial swelling, mouth sores and neck stiffness.   Eyes: Negative for other worsening pain, redness or visual disturbance.  Respiratory: Negative for shortness of breath and wheezing  Cardiovascular: Negative for chest pain and palpitations.  Gastrointestinal: Negative for diarrhea, blood in stool, abdominal distention or other pain Genitourinary: Negative for hematuria, flank pain or change in urine volume.  Musculoskeletal: Negative for myalgias or other joint complaints.  Skin: Negative for color change and wound or drainage.  Neurological: Negative for syncope and numbness. other than noted Hematological: Negative for adenopathy. or other swelling Psychiatric/Behavioral: Negative for hallucinations, SI, self-injury, decreased concentration or other worsening agitation.      Objective:   Physical Exam BP 128/72 mmHg  Pulse 82  Temp(Src) 98.4 F (36.9 C) (Oral)  Ht 5\' 5"  (1.651 m)  Wt 190 lb (86.183 kg)  BMI 31.62 kg/m2  SpO2 97% VS noted, mod obese Constitutional: Pt is oriented to person, place, and time. Appears well-developed and well-nourished, in no significant distress Head: Normocephalic and atraumatic.  Right Ear: External ear normal.  Left Ear: External ear normal.  Nose: Nose normal.  Mouth/Throat: Oropharynx is clear and moist.  Eyes: Conjunctivae and EOM are normal. Pupils are equal, round, and reactive to light.  Neck: Normal  range of motion. Neck supple. No JVD present. No tracheal deviation present or significant neck LA or mass Cardiovascular: Normal rate, regular rhythm, normal heart sounds and intact distal pulses.   Pulmonary/Chest: Effort normal and breath sounds without rales or wheezing  Abdominal: Soft. Bowel sounds are normal. NT. No HSM  Musculoskeletal: Normal range of motion. Exhibits no edema.  Lymphadenopathy:  Has no cervical adenopathy.  Neurological: Pt is alert and oriented to person, place, and time. Pt has normal reflexes. No cranial nerve deficit. Motor grossly intact Skin: Skin is warm and dry. No rash noted.  Psychiatric:  Has normal mood and affect. Behavior is normal.     Assessment & Plan:

## 2015-02-14 NOTE — Progress Notes (Signed)
Pre visit review using our clinic review tool, if applicable. No additional management support is needed unless otherwise documented below in the visit note. 

## 2015-02-14 NOTE — Patient Instructions (Addendum)
You had the Prevnar pneumonia shot today  Please continue all other medications as before, and refills have been done if requested.  Please have the pharmacy call with any other refills you may need.  Please continue your efforts at being more active, low cholesterol diet, and weight control.  You are otherwise up to date with prevention measures today.  Please keep your appointments with your specialists as you may have planned  Please go to the LAB in the Basement (turn left off the elevator) for the tests to be done today  You will be contacted by phone if any changes need to be made immediately.  Otherwise, you will receive a letter about your results with an explanation, but please check with MyChart first.  Please remember to sign up for MyChart if you have not done so, as this will be important to you in the future with finding out test results, communicating by private email, and scheduling acute appointments online when needed.  Please return in 1 year for your yearly visit, or sooner if needed, with Lab testing done 3-5 days before  

## 2015-02-15 LAB — HEPATITIS C ANTIBODY: HCV Ab: NEGATIVE

## 2015-02-16 ENCOUNTER — Other Ambulatory Visit: Payer: Self-pay | Admitting: Internal Medicine

## 2015-02-16 ENCOUNTER — Encounter: Payer: Self-pay | Admitting: Internal Medicine

## 2015-02-16 ENCOUNTER — Other Ambulatory Visit: Payer: Self-pay

## 2015-02-16 DIAGNOSIS — E785 Hyperlipidemia, unspecified: Secondary | ICD-10-CM

## 2015-02-16 HISTORY — DX: Hyperlipidemia, unspecified: E78.5

## 2015-02-16 MED ORDER — ROSUVASTATIN CALCIUM 10 MG PO TABS: 10.0000 mg | ORAL_TABLET | Freq: Every day | ORAL | Status: DC

## 2015-02-17 NOTE — Assessment & Plan Note (Signed)
stable overall by history and exam, recent data reviewed with pt, and pt to continue medical treatment as before,  to f/u any worsening symptoms or concerns BP Readings from Last 3 Encounters:  02/14/15 128/72  11/01/13 132/72  03/10/13 120/64

## 2015-02-17 NOTE — Assessment & Plan Note (Signed)
Asympt, stable overall by history and exam, recent data reviewed with pt, and pt to continue medical treatment as before,  to f/u any worsening symptoms or concerns Lab Results  Component Value Date   HGBA1C 5.5 02/14/2015

## 2015-02-17 NOTE — Assessment & Plan Note (Signed)

## 2016-02-20 ENCOUNTER — Encounter: Payer: Medicare Other | Admitting: Internal Medicine

## 2016-03-11 ENCOUNTER — Ambulatory Visit (INDEPENDENT_AMBULATORY_CARE_PROVIDER_SITE_OTHER): Payer: Medicare Other

## 2016-03-11 DIAGNOSIS — Z23 Encounter for immunization: Secondary | ICD-10-CM

## 2016-04-17 ENCOUNTER — Other Ambulatory Visit: Payer: Self-pay | Admitting: Internal Medicine

## 2016-04-30 ENCOUNTER — Encounter: Payer: Medicare Other | Admitting: Internal Medicine

## 2016-06-02 DIAGNOSIS — D3132 Benign neoplasm of left choroid: Secondary | ICD-10-CM | POA: Diagnosis not present

## 2016-06-02 DIAGNOSIS — H52203 Unspecified astigmatism, bilateral: Secondary | ICD-10-CM | POA: Diagnosis not present

## 2016-06-02 DIAGNOSIS — H26493 Other secondary cataract, bilateral: Secondary | ICD-10-CM | POA: Diagnosis not present

## 2016-06-18 ENCOUNTER — Encounter: Payer: Medicare Other | Admitting: Internal Medicine

## 2016-08-13 ENCOUNTER — Other Ambulatory Visit (INDEPENDENT_AMBULATORY_CARE_PROVIDER_SITE_OTHER): Payer: Medicare Other

## 2016-08-13 ENCOUNTER — Encounter: Payer: Self-pay | Admitting: Internal Medicine

## 2016-08-13 ENCOUNTER — Ambulatory Visit (INDEPENDENT_AMBULATORY_CARE_PROVIDER_SITE_OTHER): Payer: Medicare Other | Admitting: Internal Medicine

## 2016-08-13 VITALS — BP 154/92 | HR 75 | Ht 65.0 in | Wt 190.0 lb

## 2016-08-13 DIAGNOSIS — R7989 Other specified abnormal findings of blood chemistry: Secondary | ICD-10-CM

## 2016-08-13 DIAGNOSIS — R739 Hyperglycemia, unspecified: Secondary | ICD-10-CM

## 2016-08-13 DIAGNOSIS — E785 Hyperlipidemia, unspecified: Secondary | ICD-10-CM

## 2016-08-13 DIAGNOSIS — I10 Essential (primary) hypertension: Secondary | ICD-10-CM | POA: Diagnosis not present

## 2016-08-13 DIAGNOSIS — E2839 Other primary ovarian failure: Secondary | ICD-10-CM | POA: Diagnosis not present

## 2016-08-13 DIAGNOSIS — Z1239 Encounter for other screening for malignant neoplasm of breast: Secondary | ICD-10-CM

## 2016-08-13 DIAGNOSIS — Z0000 Encounter for general adult medical examination without abnormal findings: Secondary | ICD-10-CM | POA: Diagnosis not present

## 2016-08-13 DIAGNOSIS — Z1231 Encounter for screening mammogram for malignant neoplasm of breast: Secondary | ICD-10-CM

## 2016-08-13 LAB — CBC WITH DIFFERENTIAL/PLATELET
BASOS ABS: 0.1 10*3/uL (ref 0.0–0.1)
Basophils Relative: 0.9 % (ref 0.0–3.0)
Eosinophils Absolute: 0.1 10*3/uL (ref 0.0–0.7)
Eosinophils Relative: 2.4 % (ref 0.0–5.0)
HEMATOCRIT: 38.1 % (ref 36.0–46.0)
Hemoglobin: 13.4 g/dL (ref 12.0–15.0)
LYMPHS PCT: 28.7 % (ref 12.0–46.0)
Lymphs Abs: 1.6 10*3/uL (ref 0.7–4.0)
MCHC: 35.1 g/dL (ref 30.0–36.0)
MCV: 91.3 fl (ref 78.0–100.0)
MONOS PCT: 8.4 % (ref 3.0–12.0)
Monocytes Absolute: 0.5 10*3/uL (ref 0.1–1.0)
NEUTROS ABS: 3.4 10*3/uL (ref 1.4–7.7)
Neutrophils Relative %: 59.6 % (ref 43.0–77.0)
Platelets: 223 10*3/uL (ref 150.0–400.0)
RBC: 4.17 Mil/uL (ref 3.87–5.11)
RDW: 13.5 % (ref 11.5–15.5)
WBC: 5.6 10*3/uL (ref 4.0–10.5)

## 2016-08-13 LAB — BASIC METABOLIC PANEL
BUN: 14 mg/dL (ref 6–23)
CALCIUM: 10.1 mg/dL (ref 8.4–10.5)
CO2: 28 meq/L (ref 19–32)
CREATININE: 0.72 mg/dL (ref 0.40–1.20)
Chloride: 103 mEq/L (ref 96–112)
GFR: 84.96 mL/min (ref >60.00)
Glucose, Bld: 105 mg/dL — ABNORMAL HIGH (ref 70–99)
Potassium: 3.8 mEq/L (ref 3.5–5.1)
Sodium: 140 mEq/L (ref 135–145)

## 2016-08-13 LAB — LIPID PANEL
CHOL/HDL RATIO: 4
Cholesterol: 188 mg/dL (ref 0–200)
HDL: 44.2 mg/dL (ref >39.00)
NONHDL: 144.23
TRIGLYCERIDES: 224 mg/dL — AB (ref 0.0–149.0)
VLDL: 44.8 mg/dL — AB (ref 0.0–40.0)

## 2016-08-13 LAB — LDL CHOLESTEROL, DIRECT: Direct LDL: 116 mg/dL

## 2016-08-13 LAB — URINALYSIS, ROUTINE W REFLEX MICROSCOPIC
BILIRUBIN URINE: NEGATIVE
Hgb urine dipstick: NEGATIVE
KETONES UR: NEGATIVE
Nitrite: NEGATIVE
RBC / HPF: NONE SEEN (ref 0–?)
Specific Gravity, Urine: 1.01 (ref 1.000–1.030)
Total Protein, Urine: NEGATIVE
UROBILINOGEN UA: 0.2 (ref 0.0–1.0)
Urine Glucose: NEGATIVE
pH: 7 (ref 5.0–8.0)

## 2016-08-13 LAB — HEPATIC FUNCTION PANEL
ALBUMIN: 4.6 g/dL (ref 3.5–5.2)
ALT: 15 U/L (ref 0–35)
AST: 17 U/L (ref 0–37)
Alkaline Phosphatase: 70 U/L (ref 39–117)
Bilirubin, Direct: 0.2 mg/dL (ref 0.0–0.3)
TOTAL PROTEIN: 7.3 g/dL (ref 6.0–8.3)
Total Bilirubin: 1 mg/dL (ref 0.2–1.2)

## 2016-08-13 LAB — TSH: TSH: 0.92 u[IU]/mL (ref 0.35–4.50)

## 2016-08-13 LAB — HEMOGLOBIN A1C: Hgb A1c MFr Bld: 5.5 % (ref 4.6–6.5)

## 2016-08-13 MED ORDER — FEXOFENADINE HCL 180 MG PO TABS: 180.0000 mg | ORAL_TABLET | Freq: Every day | ORAL | 3 refills | Status: AC | PRN

## 2016-08-13 MED ORDER — LOSARTAN POTASSIUM 50 MG PO TABS: 50.0000 mg | ORAL_TABLET | Freq: Every day | ORAL | 3 refills | Status: DC

## 2016-08-13 MED ORDER — HYDROCHLOROTHIAZIDE 25 MG PO TABS: 25.0000 mg | ORAL_TABLET | Freq: Every day | ORAL | 3 refills | Status: DC

## 2016-08-13 NOTE — Assessment & Plan Note (Signed)

## 2016-08-13 NOTE — Progress Notes (Signed)
Subjective:    Patient ID: Alyssa Ho, female    DOB: May 23, 1945, 71 y.o.   MRN: [id number]  HPI  Here for wellness and f/u;  Overall doing ok;  Pt denies Chest pain, worsening SOB, DOE, wheezing, orthopnea, PND, worsening LE edema, palpitations, dizziness or syncope.  Pt denies neurological change such as new headache, facial or extremity weakness.  Pt denies polydipsia, polyuria, or low sugar symptoms. Pt states overall good compliance with treatment and medications, good tolerability, and has been trying to follow appropriate diet.  Pt denies worsening depressive symptoms, suicidal ideation or panic. No fever, night sweats, wt loss, loss of appetite, or other constitutional symptoms.  Pt states good ability with ADL's, has low fall risk, home safety reviewed and adequate, no other significant changes in hearing or vision, and only occasionally active with exercise.  BP at home has been < 14090 but not checked recently.  Daughter is nurse and can do this for her. Husband had trouble with statin intolerance, so she will not take. Might consider zetia.   BP Readings from Last 3 Encounters:  08/13/16 (!) 154/92  02/14/15 128/72  11/01/13 132/72   Wt Readings from Last 3 Encounters:  08/13/16 190 lb (86.2 kg)  02/14/15 190 lb (86.2 kg)  11/01/13 180 lb 6 oz (81.8 kg)   Past Medical History:  Diagnosis Date  . ALLERGIC RHINITIS 12/02/2007  . ANEMIA-IRON DEFICIENCY 12/02/2007  . COLONIC POLYPS, HX OF 12/02/2007  . Hyperlipidemia 02/16/2015  . HYPERTENSION 12/02/2007   Past Surgical History:  Procedure Laterality Date  . ABDOMINAL HYSTERECTOMY    . CATARACT EXTRACTION    . TONSILLECTOMY      reports that she has never smoked. She has never used smokeless tobacco. She reports that she does not drink alcohol or use drugs. family history includes Alcohol abuse in her father and paternal grandfather; Cancer in her father and mother; Diabetes in her father and paternal uncle; Heart disease in  her paternal grandfather; Stroke in her paternal grandfather. No Known Allergies Current Outpatient Prescriptions on File Prior to Visit  Medication Sig Dispense Refill  . aspirin 81 MG tablet Take 81 mg by mouth daily.      . Multiple Vitamins-Minerals (ADEKS) chewable tablet Chew 1 tablet by mouth daily.     No current facility-administered medications on file prior to visit.    Review of Systems Constitutional: Negative for other unusual diaphoresis, sweats, appetite or weight changes HENT: Negative for other worsening hearing loss, ear pain, facial swelling, mouth sores or neck stiffness.   Eyes: Negative for other worsening pain, redness or other visual disturbance.  Respiratory: Negative for other stridor or swelling Cardiovascular: Negative for other palpitations or other chest pain  Gastrointestinal: Negative for worsening diarrhea or loose stools, blood in stool, distention or other pain Genitourinary: Negative for hematuria, flank pain or other change in urine volume.  Musculoskeletal: Negative for myalgias or other joint swelling.  Skin: Negative for other color change, or other wound or worsening drainage.  Neurological: Negative for other syncope or numbness. Hematological: Negative for other adenopathy or swelling Psychiatric/Behavioral: Negative for hallucinations, other worsening agitation, SI, self-injury, or new decreased concentration All other system neg per pt    Objective:   Physical Exam BP (!) 154/92   Pulse 75   Ht 5\' 5"  (1.651 m)   Wt 190 lb (86.2 kg)   SpO2 99%   BMI 31.62 kg/m  VS noted, not ill appearing Constitutional: Pt  is oriented to person, place, and time. Appears well-developed and well-nourished, in no significant distress and comfortable Head: Normocephalic and atraumatic  Eyes: Conjunctivae and EOM are normal. Pupils are equal, round, and reactive to light Right Ear: External ear normal without discharge Left Ear: External ear normal  without discharge Nose: Nose without discharge or deformity Mouth/Throat: Oropharynx is without other ulcerations and moist  Neck: Normal range of motion. Neck supple. No JVD present. No tracheal deviation present or significant neck LA or mass Cardiovascular: Normal rate, regular rhythm, normal heart sounds and intact distal pulses.   Pulmonary/Chest: WOB normal and breath sounds without rales or wheezing  Abdominal: Soft. Bowel sounds are normal. NT. No HSM  Musculoskeletal: Normal range of motion. Exhibits no edema Lymphadenopathy: Has no other cervical adenopathy.  Neurological: Pt is alert and oriented to person, place, and time. Pt has normal reflexes. No cranial nerve deficit. Motor grossly intact, Gait intact Skin: Skin is warm and dry. No rash noted or new ulcerations Psychiatric:  Has normal mood and affect. Behavior is normal without agitation No other exam findings  Lab Results  Component Value Date   WBC 7.0 02/14/2015   HGB 13.1 02/14/2015   HCT 38.5 02/14/2015   PLT 245.0 02/14/2015   GLUCOSE 114 (H) 02/14/2015   CHOL 207 (H) 02/14/2015   TRIG 297.0 (H) 02/14/2015   HDL 45.50 02/14/2015   LDLDIRECT 122.0 02/14/2015   LDLCALC 117 (H) 02/07/2011   ALT 18 02/14/2015   AST 19 02/14/2015   NA 140 02/14/2015   K 3.7 02/14/2015   CL 104 02/14/2015   CREATININE 0.69 02/14/2015   BUN 14 02/14/2015   CO2 28 02/14/2015   TSH 0.64 02/14/2015   HGBA1C 5.5 02/14/2015      Assessment & Plan:

## 2016-08-13 NOTE — Assessment & Plan Note (Signed)
Pt states does not want to consider taking statin, consider zetia for ldl > 100

## 2016-08-13 NOTE — Assessment & Plan Note (Signed)
No hx of DM, for wt control, diet, check a1c

## 2016-08-13 NOTE — Patient Instructions (Signed)
Please be sure to check your Blood Pressures at home several times per wk for the next 3 weeks, and call if the average is more then 140/90  Please continue all other medications as before, and refills have been done if requested.  Please have the pharmacy call with any other refills you may need.  Please continue your efforts at being more active, low cholesterol diet, and weight control.  You are otherwise up to date with prevention measures today.  Please keep your appointments with your specialists as you may have planned  You will be contacted regarding the referral for: Mammogram  Please schedule the bone density test before leaving today at the scheduling desk (where you check out)  Please go to the LAB in the Basement (turn left off the elevator) for the tests to be done today  You will be contacted by phone if any changes need to be made immediately.  Otherwise, you will receive a letter about your results with an explanation, but please check with MyChart first.  Please remember to sign up for MyChart if you have not done so, as this will be important to you in the future with finding out test results, communicating by private email, and scheduling acute appointments online when needed.  Please return in 1 year for your yearly visit, or sooner if needed, with Lab testing done 3-5 days before

## 2016-08-13 NOTE — Assessment & Plan Note (Signed)
Mild elev today with good med compliacne, wt no change from last visit, pt asks to not change med for now, will cont to monitor over the next few wks and call for > 140/90

## 2016-10-15 ENCOUNTER — Inpatient Hospital Stay: Admission: RE | Admit: 2016-10-15 | Payer: Medicare Other | Source: Ambulatory Visit

## 2016-10-20 ENCOUNTER — Ambulatory Visit (INDEPENDENT_AMBULATORY_CARE_PROVIDER_SITE_OTHER)
Admission: RE | Admit: 2016-10-20 | Discharge: 2016-10-20 | Disposition: A | Discharge status: Home / Self Care | Payer: Medicare Other | Source: Ambulatory Visit | Attending: Internal Medicine | Admitting: Internal Medicine

## 2016-10-20 DIAGNOSIS — E2839 Other primary ovarian failure: Secondary | ICD-10-CM | POA: Diagnosis not present

## 2016-10-24 ENCOUNTER — Other Ambulatory Visit: Payer: Self-pay | Admitting: Internal Medicine

## 2016-10-24 ENCOUNTER — Telehealth: Payer: Self-pay

## 2016-10-24 MED ORDER — ALENDRONATE SODIUM 70 MG PO TABS: 70.0000 mg | ORAL_TABLET | ORAL | 3 refills | Status: DC

## 2016-10-24 NOTE — Telephone Encounter (Signed)
-----   Message from Biagio Borg, MD sent at 10/24/2016 12:43 PM EDT ----- Left message on MyChart, pt to cont same tx except  The test results show that your current treatment is OK, except the bone density test is significant for moderate bone loss.  We would like to start fosamax 70 mg weekly to help slow this down and prevent full osteoporosis  You should also hear from the office about this as well. Redmond Baseman to please inform pt, I will do rx

## 2016-10-24 NOTE — Telephone Encounter (Signed)
Called pt, Alyssa Ho.   

## 2016-12-24 ENCOUNTER — Ambulatory Visit (INDEPENDENT_AMBULATORY_CARE_PROVIDER_SITE_OTHER): Payer: Medicare Other

## 2016-12-24 DIAGNOSIS — Z23 Encounter for immunization: Secondary | ICD-10-CM | POA: Diagnosis not present

## 2017-08-10 ENCOUNTER — Other Ambulatory Visit: Payer: Self-pay | Admitting: Internal Medicine

## 2017-08-19 ENCOUNTER — Encounter: Payer: Medicare Other | Admitting: Internal Medicine

## 2017-09-30 ENCOUNTER — Encounter: Payer: Medicare Other | Admitting: Internal Medicine

## 2017-10-28 ENCOUNTER — Ambulatory Visit (INDEPENDENT_AMBULATORY_CARE_PROVIDER_SITE_OTHER): Payer: Medicare Other | Admitting: Internal Medicine

## 2017-10-28 ENCOUNTER — Other Ambulatory Visit (INDEPENDENT_AMBULATORY_CARE_PROVIDER_SITE_OTHER): Payer: Medicare Other

## 2017-10-28 ENCOUNTER — Encounter: Payer: Self-pay | Admitting: Internal Medicine

## 2017-10-28 ENCOUNTER — Other Ambulatory Visit: Payer: Self-pay | Admitting: Internal Medicine

## 2017-10-28 VITALS — BP 136/84 | HR 70 | Temp 98.3°F | Ht 65.0 in | Wt 187.0 lb

## 2017-10-28 DIAGNOSIS — Z0000 Encounter for general adult medical examination without abnormal findings: Secondary | ICD-10-CM

## 2017-10-28 DIAGNOSIS — R739 Hyperglycemia, unspecified: Secondary | ICD-10-CM

## 2017-10-28 DIAGNOSIS — Z23 Encounter for immunization: Secondary | ICD-10-CM | POA: Diagnosis not present

## 2017-10-28 DIAGNOSIS — Z1231 Encounter for screening mammogram for malignant neoplasm of breast: Secondary | ICD-10-CM | POA: Diagnosis not present

## 2017-10-28 DIAGNOSIS — E785 Hyperlipidemia, unspecified: Secondary | ICD-10-CM | POA: Diagnosis not present

## 2017-10-28 DIAGNOSIS — Z1239 Encounter for other screening for malignant neoplasm of breast: Secondary | ICD-10-CM

## 2017-10-28 LAB — HEMOGLOBIN A1C: Hgb A1c MFr Bld: 5.4 % (ref 4.6–6.5)

## 2017-10-28 LAB — LIPID PANEL
Cholesterol: 180 mg/dL (ref 0–200)
HDL: 42.9 mg/dL (ref >39.00)
NonHDL: 137.23
TRIGLYCERIDES: 206 mg/dL — AB (ref 0.0–149.0)
Total CHOL/HDL Ratio: 4
VLDL: 41.2 mg/dL — ABNORMAL HIGH (ref 0.0–40.0)

## 2017-10-28 LAB — CBC WITH DIFFERENTIAL/PLATELET
Basophils Absolute: 0 10*3/uL (ref 0.0–0.1)
Basophils Relative: 0.8 % (ref 0.0–3.0)
Eosinophils Absolute: 0.2 10*3/uL (ref 0.0–0.7)
Eosinophils Relative: 2.6 % (ref 0.0–5.0)
HCT: 36 % (ref 36.0–46.0)
HEMOGLOBIN: 12.7 g/dL (ref 12.0–15.0)
LYMPHS ABS: 1.7 10*3/uL (ref 0.7–4.0)
Lymphocytes Relative: 29.8 % (ref 12.0–46.0)
MCHC: 35.4 g/dL (ref 30.0–36.0)
MCV: 91.5 fl (ref 78.0–100.0)
MONOS PCT: 7.9 % (ref 3.0–12.0)
Monocytes Absolute: 0.5 10*3/uL (ref 0.1–1.0)
NEUTROS PCT: 58.9 % (ref 43.0–77.0)
Neutro Abs: 3.4 10*3/uL (ref 1.4–7.7)
Platelets: 215 10*3/uL (ref 150.0–400.0)
RBC: 3.93 Mil/uL (ref 3.87–5.11)
RDW: 12.7 % (ref 11.5–15.5)
WBC: 5.8 10*3/uL (ref 4.0–10.5)

## 2017-10-28 LAB — BASIC METABOLIC PANEL
BUN: 17 mg/dL (ref 6–23)
CALCIUM: 9.7 mg/dL (ref 8.4–10.5)
CO2: 29 mEq/L (ref 19–32)
Chloride: 105 mEq/L (ref 96–112)
Creatinine, Ser: 0.74 mg/dL (ref 0.40–1.20)
GFR: 82.03 mL/min (ref >60.00)
Glucose, Bld: 106 mg/dL — ABNORMAL HIGH (ref 70–99)
Potassium: 4.1 mEq/L (ref 3.5–5.1)
SODIUM: 141 meq/L (ref 135–145)

## 2017-10-28 LAB — URINALYSIS, ROUTINE W REFLEX MICROSCOPIC
BILIRUBIN URINE: NEGATIVE
HGB URINE DIPSTICK: NEGATIVE
KETONES UR: NEGATIVE
LEUKOCYTES UA: NEGATIVE
NITRITE: NEGATIVE
RBC / HPF: NONE SEEN (ref 0–?)
Specific Gravity, Urine: 1.015 (ref 1.000–1.030)
TOTAL PROTEIN, URINE-UPE24: NEGATIVE
Urine Glucose: NEGATIVE
Urobilinogen, UA: 0.2 (ref 0.0–1.0)
pH: 8 (ref 5.0–8.0)

## 2017-10-28 LAB — HEPATIC FUNCTION PANEL
ALBUMIN: 4.3 g/dL (ref 3.5–5.2)
ALT: 13 U/L (ref 0–35)
AST: 17 U/L (ref 0–37)
Alkaline Phosphatase: 67 U/L (ref 39–117)
Bilirubin, Direct: 0.1 mg/dL (ref 0.0–0.3)
TOTAL PROTEIN: 7.1 g/dL (ref 6.0–8.3)
Total Bilirubin: 0.8 mg/dL (ref 0.2–1.2)

## 2017-10-28 LAB — TSH: TSH: 0.92 u[IU]/mL (ref 0.35–4.50)

## 2017-10-28 LAB — LDL CHOLESTEROL, DIRECT: Direct LDL: 123 mg/dL

## 2017-10-28 MED ORDER — ROSUVASTATIN CALCIUM 10 MG PO TABS: 10.0000 mg | ORAL_TABLET | Freq: Every day | ORAL | 3 refills | Status: DC

## 2017-10-28 NOTE — Patient Instructions (Addendum)
You had the flu shot today  You will be contacted regarding the referral for: colonoscopy  You will be contacted regarding the referral for: mammogram  Please continue all other medications as before, and refills have been done if requested.  Please have the pharmacy call with any other refills you may need.  Please continue your efforts at being more active, low cholesterol diet, and weight control.  You are otherwise up to date with prevention measures today.  Please keep your appointments with your specialists as you may have planned  Please go to the LAB in the Basement (turn left off the elevator) for the tests to be done today  You will be contacted by phone if any changes need to be made immediately.  Otherwise, you will receive a letter about your results with an explanation, but please check with MyChart first.  Please remember to sign up for MyChart if you have not done so, as this will be important to you in the future with finding out test results, communicating by private email, and scheduling acute appointments online when needed.  Please return in 1 year for your yearly visit, or sooner if needed, with Lab testing done 3-5 days before

## 2017-10-28 NOTE — Progress Notes (Signed)
Subjective:    Patient ID: Alyssa Ho, female    DOB: 02-26-1945, 72 y.o.   MRN: [id number]  HPI  Here for wellness and f/u;  Overall doing ok;  Pt denies Chest pain, worsening SOB, DOE, wheezing, orthopnea, PND, worsening LE edema, palpitations, dizziness or syncope.  Pt denies neurological change such as new headache, facial or extremity weakness.  Pt denies polydipsia, polyuria, or low sugar symptoms. Pt states overall good compliance with treatment and medications, good tolerability, and has been trying to follow appropriate diet.  Pt denies worsening depressive symptoms, suicidal ideation or panic. No fever, night sweats, wt loss, loss of appetite, or other constitutional symptoms.  Pt states good ability with ADL's, has low fall risk, home safety reviewed and adequate, no other significant changes in hearing or vision, and only occasionally active with exercise. Did not tolerate fosamax due to GI upset. No new complaints Past Medical History:  Diagnosis Date  . ALLERGIC RHINITIS 12/02/2007  . ANEMIA-IRON DEFICIENCY 12/02/2007  . COLONIC POLYPS, HX OF 12/02/2007  . Hyperlipidemia 02/16/2015  . HYPERTENSION 12/02/2007   Past Surgical History:  Procedure Laterality Date  . ABDOMINAL HYSTERECTOMY    . CATARACT EXTRACTION    . TONSILLECTOMY      reports that she has never smoked. She has never used smokeless tobacco. She reports that she does not drink alcohol or use drugs. family history includes Alcohol abuse in her father and paternal grandfather; Cancer in her father and mother; Diabetes in her father and paternal uncle; Heart disease in her paternal grandfather; Stroke in her paternal grandfather. No Known Allergies Current Outpatient Medications on File Prior to Visit  Medication Sig Dispense Refill  . aspirin 81 MG tablet Take 81 mg by mouth daily.      . fexofenadine (ALLEGRA) 180 MG tablet Take 1 tablet (180 mg total) by mouth daily as needed for allergies or rhinitis. 90 tablet  3  . hydrochlorothiazide (HYDRODIURIL) 25 MG tablet TAKE ONE TABLET BY MOUTH ONCE DAILY 90 tablet 0  . losartan (COZAAR) 50 MG tablet TAKE ONE TABLET EVERY DAY 90 tablet 0  . Multiple Vitamins-Minerals (ADEKS) chewable tablet Chew 1 tablet by mouth daily.     No current facility-administered medications on file prior to visit.    Review of Systems Constitutional: Negative for other unusual diaphoresis, sweats, appetite or weight changes HENT: Negative for other worsening hearing loss, ear pain, facial swelling, mouth sores or neck stiffness.   Eyes: Negative for other worsening pain, redness or other visual disturbance.  Respiratory: Negative for other stridor or swelling Cardiovascular: Negative for other palpitations or other chest pain  Gastrointestinal: Negative for worsening diarrhea or loose stools, blood in stool, distention or other pain Genitourinary: Negative for hematuria, flank pain or other change in urine volume.  Musculoskeletal: Negative for myalgias or other joint swelling.  Skin: Negative for other color change, or other wound or worsening drainage.  Neurological: Negative for other syncope or numbness. Hematological: Negative for other adenopathy or swelling Psychiatric/Behavioral: Negative for hallucinations, other worsening agitation, SI, self-injury, or new decreased concentration All other system neg per pt    Objective:   Physical Exam BP 136/84   Pulse 70   Temp 98.3 F (36.8 C) (Oral)   Ht 5\' 5"  (1.651 m)   Wt 187 lb (84.8 kg)   SpO2 95%   BMI 31.12 kg/m  VS noted,  Constitutional: Pt is oriented to person, place, and time. Appears well-developed and well-nourished,  in no significant distress and comfortable Head: Normocephalic and atraumatic  Eyes: Conjunctivae and EOM are normal. Pupils are equal, round, and reactive to light Right Ear: External ear normal without discharge Left Ear: External ear normal without discharge Nose: Nose without discharge  or deformity Mouth/Throat: Oropharynx is without other ulcerations and moist  Neck: Normal range of motion. Neck supple. No JVD present. No tracheal deviation present or significant neck LA or mass Cardiovascular: Normal rate, regular rhythm, normal heart sounds and intact distal pulses.   Pulmonary/Chest: WOB normal and breath sounds without rales or wheezing  Abdominal: Soft. Bowel sounds are normal. NT. No HSM  Musculoskeletal: Normal range of motion. Exhibits no edema Lymphadenopathy: Has no other cervical adenopathy.  Neurological: Pt is alert and oriented to person, place, and time. Pt has normal reflexes. No cranial nerve deficit. Motor grossly intact, Gait intact Skin: Skin is warm and dry. No rash noted or new ulcerations Psychiatric:  Has normal mood and affect. Behavior is normal without agitation No other exam findings Lab Results  Component Value Date   WBC 5.6 08/13/2016   HGB 13.4 08/13/2016   HCT 38.1 08/13/2016   PLT 223.0 08/13/2016   GLUCOSE 105 (H) 08/13/2016   CHOL 188 08/13/2016   TRIG 224.0 (H) 08/13/2016   HDL 44.20 08/13/2016   LDLDIRECT 116.0 08/13/2016   LDLCALC 117 (H) 02/07/2011   ALT 15 08/13/2016   AST 17 08/13/2016   NA 140 08/13/2016   K 3.8 08/13/2016   CL 103 08/13/2016   CREATININE 0.72 08/13/2016   BUN 14 08/13/2016   CO2 28 08/13/2016   TSH 0.92 08/13/2016   HGBA1C 5.5 08/13/2016       Assessment & Plan:

## 2017-10-28 NOTE — Assessment & Plan Note (Signed)
stable overall by history and exam, recent data reviewed with pt, and pt to continue medical treatment as before,  to f/u any worsening symptoms or concerns Lab Results  Component Value Date   HGBA1C 5.5 08/13/2016

## 2017-10-28 NOTE — Assessment & Plan Note (Signed)

## 2017-10-29 ENCOUNTER — Telehealth: Payer: Self-pay

## 2017-10-29 NOTE — Telephone Encounter (Signed)
-----   Message from Biagio Borg, MD sent at 10/28/2017  9:39 PM EDT ----- Left message on MyChart, pt to cont same tx except  The test results show that your current treatment is OK, except the LDL cholesterol is high.  Please start crestor 10 mg per day, as this will help reduce the future risk of heart disease and stroke.   Dixon Luczak to please inform pt, I will do rx

## 2017-10-29 NOTE — Telephone Encounter (Signed)
Called pt, LVM.   CRM created.  

## 2017-11-16 ENCOUNTER — Telehealth: Payer: Self-pay | Admitting: Internal Medicine

## 2017-11-16 MED ORDER — HYDROCHLOROTHIAZIDE 25 MG PO TABS: 25.0000 mg | ORAL_TABLET | Freq: Every day | ORAL | 3 refills | Status: DC

## 2017-11-16 MED ORDER — LOSARTAN POTASSIUM 50 MG PO TABS: 50.0000 mg | ORAL_TABLET | Freq: Every day | ORAL | 3 refills | Status: DC

## 2017-11-16 NOTE — Telephone Encounter (Signed)
Copied from [place] [phone]. Topic: Quick Communication - Rx Refill/Question >> Nov 16, 2017  9:11 AM Mylinda Latina, NT wrote: Medication:losartan (COZAAR) 50 MG tablet, hydrochlorothiazide (HYDRODIURIL) 25 MG tablet  Has the patient contacted their pharmacy? No. (Agent: If no, request that the patient contact the pharmacy for the refill.) (Agent: If yes, when and what did the pharmacy advise?)  Preferred Pharmacy (with phone number or street name): [place], [place] - [address] [phone] (Phone) [phone] (Fax)    Agent: Please be advised that RX refills may take up to 3 business days. We ask that you follow-up with your pharmacy.

## 2017-11-16 NOTE — Telephone Encounter (Signed)
losartan refill Last Refill:08/10/17 # 90 no RF Last OV: 10/28/17 PCP: Dr. Jenny Reichmann Pharmacy:[facility]  HCTZ refill Last Refill:08/10/17 # 90 no RF Last OV: 10/28/17

## 2017-11-30 ENCOUNTER — Encounter: Payer: Medicare Other | Admitting: Internal Medicine

## 2018-03-22 ENCOUNTER — Encounter: Payer: Self-pay | Admitting: Internal Medicine

## 2018-11-03 ENCOUNTER — Other Ambulatory Visit (INDEPENDENT_AMBULATORY_CARE_PROVIDER_SITE_OTHER): Payer: Medicare Other

## 2018-11-03 ENCOUNTER — Encounter: Payer: Self-pay | Admitting: Internal Medicine

## 2018-11-03 ENCOUNTER — Ambulatory Visit (INDEPENDENT_AMBULATORY_CARE_PROVIDER_SITE_OTHER): Payer: Medicare Other | Admitting: Internal Medicine

## 2018-11-03 ENCOUNTER — Other Ambulatory Visit: Payer: Self-pay | Admitting: Internal Medicine

## 2018-11-03 ENCOUNTER — Other Ambulatory Visit: Payer: Self-pay

## 2018-11-03 VITALS — BP 144/88 | HR 68 | Temp 98.0°F | Ht 65.0 in | Wt 175.0 lb

## 2018-11-03 DIAGNOSIS — Z23 Encounter for immunization: Secondary | ICD-10-CM

## 2018-11-03 DIAGNOSIS — E559 Vitamin D deficiency, unspecified: Secondary | ICD-10-CM

## 2018-11-03 DIAGNOSIS — E611 Iron deficiency: Secondary | ICD-10-CM

## 2018-11-03 DIAGNOSIS — Z0000 Encounter for general adult medical examination without abnormal findings: Secondary | ICD-10-CM | POA: Diagnosis not present

## 2018-11-03 DIAGNOSIS — R739 Hyperglycemia, unspecified: Secondary | ICD-10-CM

## 2018-11-03 DIAGNOSIS — E538 Deficiency of other specified B group vitamins: Secondary | ICD-10-CM

## 2018-11-03 LAB — LIPID PANEL
Cholesterol: 215 mg/dL — ABNORMAL HIGH (ref 0–200)
HDL: 54.4 mg/dL (ref >39.00)
LDL Cholesterol: 130 mg/dL — ABNORMAL HIGH (ref 0–99)
NonHDL: 160.16
Total CHOL/HDL Ratio: 4
Triglycerides: 150 mg/dL — ABNORMAL HIGH (ref 0.0–149.0)
VLDL: 30 mg/dL (ref 0.0–40.0)

## 2018-11-03 LAB — CBC WITH DIFFERENTIAL/PLATELET
Basophils Absolute: 0.1 10*3/uL (ref 0.0–0.1)
Basophils Relative: 1 % (ref 0.0–3.0)
Eosinophils Absolute: 0.1 10*3/uL (ref 0.0–0.7)
Eosinophils Relative: 2.3 % (ref 0.0–5.0)
HCT: 37.8 % (ref 36.0–46.0)
Hemoglobin: 13 g/dL (ref 12.0–15.0)
Lymphocytes Relative: 32 % (ref 12.0–46.0)
Lymphs Abs: 1.8 10*3/uL (ref 0.7–4.0)
MCHC: 34.3 g/dL (ref 30.0–36.0)
MCV: 91.1 fl (ref 78.0–100.0)
Monocytes Absolute: 0.4 10*3/uL (ref 0.1–1.0)
Monocytes Relative: 7.6 % (ref 3.0–12.0)
Neutro Abs: 3.2 10*3/uL (ref 1.4–7.7)
Neutrophils Relative %: 57.1 % (ref 43.0–77.0)
Platelets: 226 10*3/uL (ref 150.0–400.0)
RBC: 4.15 Mil/uL (ref 3.87–5.11)
RDW: 13.2 % (ref 11.5–15.5)
WBC: 5.5 10*3/uL (ref 4.0–10.5)

## 2018-11-03 LAB — BASIC METABOLIC PANEL
BUN: 10 mg/dL (ref 6–23)
CO2: 26 mEq/L (ref 19–32)
Calcium: 9.7 mg/dL (ref 8.4–10.5)
Chloride: 104 mEq/L (ref 96–112)
Creatinine, Ser: 0.62 mg/dL (ref 0.40–1.20)
GFR: 94.4 mL/min (ref >60.00)
Glucose, Bld: 96 mg/dL (ref 70–99)
Potassium: 3.8 mEq/L (ref 3.5–5.1)
Sodium: 139 mEq/L (ref 135–145)

## 2018-11-03 LAB — HEPATIC FUNCTION PANEL
ALT: 18 U/L (ref 0–35)
AST: 21 U/L (ref 0–37)
Albumin: 4.5 g/dL (ref 3.5–5.2)
Alkaline Phosphatase: 73 U/L (ref 39–117)
Bilirubin, Direct: 0.1 mg/dL (ref 0.0–0.3)
Total Bilirubin: 1 mg/dL (ref 0.2–1.2)
Total Protein: 7.4 g/dL (ref 6.0–8.3)

## 2018-11-03 LAB — URINALYSIS, ROUTINE W REFLEX MICROSCOPIC
Bilirubin Urine: NEGATIVE
Hgb urine dipstick: NEGATIVE
Ketones, ur: NEGATIVE
Nitrite: NEGATIVE
RBC / HPF: NONE SEEN (ref 0–?)
Specific Gravity, Urine: 1.01 (ref 1.000–1.030)
Total Protein, Urine: NEGATIVE
Urine Glucose: NEGATIVE
Urobilinogen, UA: 0.2 (ref 0.0–1.0)
pH: 7 (ref 5.0–8.0)

## 2018-11-03 LAB — VITAMIN D 25 HYDROXY (VIT D DEFICIENCY, FRACTURES): VITD: 14.86 ng/mL — ABNORMAL LOW (ref 30.00–100.00)

## 2018-11-03 LAB — IBC PANEL
Iron: 109 ug/dL (ref 42–145)
Saturation Ratios: 27.3 % (ref 20.0–50.0)
Transferrin: 285 mg/dL (ref 212.0–360.0)

## 2018-11-03 LAB — HEMOGLOBIN A1C: Hgb A1c MFr Bld: 5.6 % (ref 4.6–6.5)

## 2018-11-03 LAB — TSH: TSH: 0.95 u[IU]/mL (ref 0.35–4.50)

## 2018-11-03 LAB — VITAMIN B12: Vitamin B-12: 572 pg/mL (ref 211–911)

## 2018-11-03 MED ORDER — LOSARTAN POTASSIUM 50 MG PO TABS: 50.0000 mg | ORAL_TABLET | Freq: Every day | ORAL | 3 refills | Status: DC

## 2018-11-03 MED ORDER — VITAMIN D (ERGOCALCIFEROL) 1.25 MG (50000 UNIT) PO CAPS: 50000.0000 [IU] | ORAL_CAPSULE | ORAL | 0 refills | Status: DC

## 2018-11-03 MED ORDER — HYDROCHLOROTHIAZIDE 25 MG PO TABS: 25.0000 mg | ORAL_TABLET | Freq: Every day | ORAL | 3 refills | Status: DC

## 2018-11-03 NOTE — Patient Instructions (Signed)

## 2018-11-03 NOTE — Progress Notes (Signed)
Subjective:    Patient ID: Alyssa Ho, female    DOB: Jul 03, 1945, 74 y.o.   MRN: [id number]  HPI  Here for wellness and f/u;  Overall doing ok;  Pt denies Chest pain, worsening SOB, DOE, wheezing, orthopnea, PND, worsening LE edema, palpitations, dizziness or syncope.  Pt denies neurological change such as new headache, facial or extremity weakness.  Pt denies polydipsia, polyuria, or low sugar symptoms. Pt states overall good compliance with treatment and medications, good tolerability, and has been trying to follow appropriate diet.  Pt denies worsening depressive symptoms, suicidal ideation or panic. No fever, night sweats, wt loss, loss of appetite, or other constitutional symptoms.  Pt states good ability with ADL's, has low fall risk, home safety reviewed and adequate, no other significant changes in hearing or vision, and only occasionally active with exercise. Bp usually 130/70.  BP Readings from Last 3 Encounters:  11/03/18 (!) 144/88  10/28/17 136/84  08/13/16 (!) 154/92   Wt Readings from Last 3 Encounters:  11/03/18 175 lb (79.4 kg)  10/28/17 187 lb (84.8 kg)  08/13/16 190 lb (86.2 kg)  no new complaints Past Medical History:  Diagnosis Date  . ALLERGIC RHINITIS 12/02/2007  . ANEMIA-IRON DEFICIENCY 12/02/2007  . COLONIC POLYPS, HX OF 12/02/2007  . Hyperlipidemia 02/16/2015  . HYPERTENSION 12/02/2007   Past Surgical History:  Procedure Laterality Date  . ABDOMINAL HYSTERECTOMY    . CATARACT EXTRACTION    . TONSILLECTOMY      reports that she has never smoked. She has never used smokeless tobacco. She reports that she does not drink alcohol or use drugs. family history includes Alcohol abuse in her father and paternal grandfather; Cancer in her father and mother; Diabetes in her father and paternal uncle; Heart disease in her paternal grandfather; Stroke in her paternal grandfather. No Known Allergies Current Outpatient Medications on File Prior to Visit  Medication Sig  Dispense Refill  . aspirin 81 MG tablet Take 81 mg by mouth daily.      . fexofenadine (ALLEGRA) 180 MG tablet Take 1 tablet (180 mg total) by mouth daily as needed for allergies or rhinitis. 90 tablet 3  . hydrochlorothiazide (HYDRODIURIL) 25 MG tablet Take 1 tablet (25 mg total) by mouth daily. 90 tablet 3  . losartan (COZAAR) 50 MG tablet Take 1 tablet (50 mg total) by mouth daily. 90 tablet 3  . Multiple Vitamins-Minerals (ADEKS) chewable tablet Chew 1 tablet by mouth daily.    . rosuvastatin (CRESTOR) 10 MG tablet Take 1 tablet (10 mg total) by mouth daily. 90 tablet 3   No current facility-administered medications on file prior to visit.    Review of Systems Constitutional: Negative for other unusual diaphoresis, sweats, appetite or weight changes HENT: Negative for other worsening hearing loss, ear pain, facial swelling, mouth sores or neck stiffness.   Eyes: Negative for other worsening pain, redness or other visual disturbance.  Respiratory: Negative for other stridor or swelling Cardiovascular: Negative for other palpitations or other chest pain  Gastrointestinal: Negative for worsening diarrhea or loose stools, blood in stool, distention or other pain Genitourinary: Negative for hematuria, flank pain or other change in urine volume.  Musculoskeletal: Negative for myalgias or other joint swelling.  Skin: Negative for other color change, or other wound or worsening drainage.  Neurological: Negative for other syncope or numbness. Hematological: Negative for other adenopathy or swelling Psychiatric/Behavioral: Negative for hallucinations, other worsening agitation, SI, self-injury, or new decreased concentration All other system  neg per pt    Objective:   Physical Exam BP (!) 144/88   Pulse 68   Temp 98 F (36.7 C) (Oral)   Ht 5\' 5"  (1.651 m)   Wt 175 lb (79.4 kg)   SpO2 96%   BMI 29.12 kg/m  VS noted,  Constitutional: Pt is oriented to person, place, and time. Appears  well-developed and well-nourished, in no significant distress and comfortable Head: Normocephalic and atraumatic  Eyes: Conjunctivae and EOM are normal. Pupils are equal, round, and reactive to light Right Ear: External ear normal without discharge Left Ear: External ear normal without discharge Nose: Nose without discharge or deformity Mouth/Throat: Oropharynx is without other ulcerations and moist  Neck: Normal range of motion. Neck supple. No JVD present. No tracheal deviation present or significant neck LA or mass Cardiovascular: Normal rate, regular rhythm, normal heart sounds and intact distal pulses.   Pulmonary/Chest: WOB normal and breath sounds without rales or wheezing  Abdominal: Soft. Bowel sounds are normal. NT. No HSM  Musculoskeletal: Normal range of motion. Exhibits no edema Lymphadenopathy: Has no other cervical adenopathy.  Neurological: Pt is alert and oriented to person, place, and time. Pt has normal reflexes. No cranial nerve deficit. Motor grossly intact, Gait intact Skin: Skin is warm and dry. No rash noted or new ulcerations Psychiatric:  Has normal mood and affect. Behavior is normal without agitation No other exam findings Lab Results  Component Value Date   WBC 5.8 10/28/2017   HGB 12.7 10/28/2017   HCT 36.0 10/28/2017   PLT 215.0 10/28/2017   GLUCOSE 106 (H) 10/28/2017   CHOL 180 10/28/2017   TRIG 206.0 (H) 10/28/2017   HDL 42.90 10/28/2017   LDLDIRECT 123.0 10/28/2017   LDLCALC 117 (H) 02/07/2011   ALT 13 10/28/2017   AST 17 10/28/2017   NA 141 10/28/2017   K 4.1 10/28/2017   CL 105 10/28/2017   CREATININE 0.74 10/28/2017   BUN 17 10/28/2017   CO2 29 10/28/2017   TSH 0.92 10/28/2017   HGBA1C 5.4 10/28/2017       Assessment & Plan:

## 2018-11-03 NOTE — Assessment & Plan Note (Signed)

## 2018-11-03 NOTE — Assessment & Plan Note (Signed)
stable overall by history and exam, recent data reviewed with pt, and pt to continue medical treatment as before,  to f/u any worsening symptoms or concerns  

## 2018-11-04 ENCOUNTER — Telehealth: Payer: Self-pay

## 2018-11-04 NOTE — Telephone Encounter (Signed)
-----   Message from Biagio Borg, MD sent at 11/03/2018  1:38 PM EDT ----- Letter sent, cont same tx except  The test results show that your current treatment is OK, as the tests are stable, except the Vitamin D level is low.  Please take Vitamin D 50000 units weekly for 12 weeks, then plan to change to OTC Vitamin D3 at 2000 units per day, indefinitely.Redmond Baseman to please inform pt, I will do rx

## 2018-11-04 NOTE — Telephone Encounter (Signed)
Pt has been informed of results and expressed understanding.  °

## 2019-03-27 ENCOUNTER — Ambulatory Visit: Payer: Medicare Other

## 2019-04-01 ENCOUNTER — Ambulatory Visit: Payer: Medicare Other

## 2019-04-05 ENCOUNTER — Ambulatory Visit: Payer: Medicare Other

## 2019-04-07 ENCOUNTER — Ambulatory Visit: Payer: Medicare Other

## 2019-04-07 ENCOUNTER — Ambulatory Visit: Payer: Medicare Other | Attending: Internal Medicine

## 2019-04-07 DIAGNOSIS — Z23 Encounter for immunization: Secondary | ICD-10-CM

## 2019-04-07 NOTE — Progress Notes (Signed)
   Covid-19 Vaccination Clinic  Name:  Alyssa Ho    MRN: [id number] DOB: [birth year]  04/07/2019  [name] was observed post Covid-19 immunization for 15 minutes without incidence. She was provided with Vaccine Information Sheet and instruction to access the V-Safe system.   [name] was instructed to call 911 with any severe reactions post vaccine: Marland Kitchen Difficulty breathing  . Swelling of your face and throat  . A fast heartbeat  . A bad rash all over your body  . Dizziness and weakness    Immunizations Administered    Name Date Dose VIS Date Route   Pfizer COVID-19 Vaccine 04/07/2019  5:00 PM 0.3 mL 02/04/2019 Intramuscular   Manufacturer: [place]   Lot: [id number]   [place]: [id number]

## 2019-04-30 ENCOUNTER — Ambulatory Visit: Payer: Medicare Other | Attending: Internal Medicine

## 2019-04-30 DIAGNOSIS — Z23 Encounter for immunization: Secondary | ICD-10-CM | POA: Insufficient documentation

## 2019-04-30 NOTE — Progress Notes (Signed)
   Covid-19 Vaccination Clinic  Name:  Alyssa Ho    MRN: [id number] DOB: Jul 17, 1945  04/30/2019  Alyssa Ho was observed post Covid-19 immunization for 15 minutes without incident. She was provided with Vaccine Information Sheet and instruction to access the V-Safe system.   Alyssa Ho was instructed to call 911 with any severe reactions post vaccine: Marland Kitchen Difficulty breathing  . Swelling of face and throat  . A fast heartbeat  . A bad rash all over body  . Dizziness and weakness   Immunizations Administered    Name Date Dose VIS Date Route   Pfizer COVID-19 Vaccine 04/30/2019  2:08 PM 0.3 mL 02/04/2019 Intramuscular   Manufacturer: [place]   Lot: [id number]   [place]: [id number]

## 2019-07-04 DIAGNOSIS — D3132 Benign neoplasm of left choroid: Secondary | ICD-10-CM | POA: Diagnosis not present

## 2019-07-04 DIAGNOSIS — H52203 Unspecified astigmatism, bilateral: Secondary | ICD-10-CM | POA: Diagnosis not present

## 2019-07-04 DIAGNOSIS — H26492 Other secondary cataract, left eye: Secondary | ICD-10-CM | POA: Diagnosis not present

## 2019-07-04 DIAGNOSIS — H524 Presbyopia: Secondary | ICD-10-CM | POA: Diagnosis not present

## 2019-08-11 DIAGNOSIS — H26492 Other secondary cataract, left eye: Secondary | ICD-10-CM | POA: Diagnosis not present

## 2019-10-28 ENCOUNTER — Other Ambulatory Visit: Payer: Self-pay

## 2019-10-28 ENCOUNTER — Other Ambulatory Visit: Payer: Self-pay | Admitting: Internal Medicine

## 2019-10-28 MED ORDER — HYDROCHLOROTHIAZIDE 25 MG PO TABS: 25.0000 mg | ORAL_TABLET | Freq: Every day | ORAL | 3 refills | Status: DC

## 2019-10-28 MED ORDER — LOSARTAN POTASSIUM 50 MG PO TABS: 50.0000 mg | ORAL_TABLET | Freq: Every day | ORAL | 3 refills | Status: DC

## 2019-10-28 NOTE — Telephone Encounter (Signed)
Sent in today 

## 2019-10-28 NOTE — Telephone Encounter (Signed)
   1.Medication Requested: hydrochlorothiazide (HYDRODIURIL) 25 MG tablet losartan (COZAAR) 50 MG tablet 2. Pharmacy (Name, Street, City):[facility] [place], [place] - [address]  3. On Med List: yes  4. Last Visit with PCP:   5. Next visit date with PCP: 12/06/19   Agent: Please be advised that RX refills may take up to 3 business days. We ask that you follow-up with your pharmacy.

## 2019-11-04 ENCOUNTER — Ambulatory Visit: Payer: Medicare Other

## 2019-11-04 ENCOUNTER — Encounter: Payer: Medicare Other | Admitting: Internal Medicine

## 2019-11-28 ENCOUNTER — Ambulatory Visit: Payer: Medicare Other

## 2019-11-28 ENCOUNTER — Telehealth: Payer: Self-pay

## 2019-11-28 NOTE — Telephone Encounter (Signed)
Called patient to do telephone AWV, not able to leave message (called twice).  Called cell phone no answer.

## 2019-12-06 ENCOUNTER — Encounter: Payer: Medicare Other | Admitting: Internal Medicine

## 2020-01-07 ENCOUNTER — Ambulatory Visit (INDEPENDENT_AMBULATORY_CARE_PROVIDER_SITE_OTHER): Payer: Medicare Other

## 2020-01-07 DIAGNOSIS — Z23 Encounter for immunization: Secondary | ICD-10-CM

## 2020-01-18 ENCOUNTER — Encounter: Payer: Medicare Other | Admitting: Internal Medicine

## 2020-03-05 ENCOUNTER — Encounter: Payer: Medicare Other | Admitting: Internal Medicine

## 2020-05-16 ENCOUNTER — Encounter: Payer: Medicare Other | Admitting: Internal Medicine

## 2020-08-06 ENCOUNTER — Encounter: Payer: Medicare Other | Admitting: Internal Medicine

## 2020-09-10 DIAGNOSIS — Z961 Presence of intraocular lens: Secondary | ICD-10-CM | POA: Diagnosis not present

## 2020-09-10 DIAGNOSIS — D3132 Benign neoplasm of left choroid: Secondary | ICD-10-CM | POA: Diagnosis not present

## 2020-09-10 DIAGNOSIS — H52203 Unspecified astigmatism, bilateral: Secondary | ICD-10-CM | POA: Diagnosis not present

## 2020-11-05 ENCOUNTER — Ambulatory Visit (INDEPENDENT_AMBULATORY_CARE_PROVIDER_SITE_OTHER): Payer: Medicare Other | Admitting: Internal Medicine

## 2020-11-05 ENCOUNTER — Encounter: Payer: Self-pay | Admitting: Internal Medicine

## 2020-11-05 ENCOUNTER — Other Ambulatory Visit: Payer: Self-pay

## 2020-11-05 VITALS — BP 152/70 | HR 75 | Temp 98.3°F | Ht 65.0 in | Wt 186.0 lb

## 2020-11-05 DIAGNOSIS — E559 Vitamin D deficiency, unspecified: Secondary | ICD-10-CM

## 2020-11-05 DIAGNOSIS — E538 Deficiency of other specified B group vitamins: Secondary | ICD-10-CM

## 2020-11-05 DIAGNOSIS — Z23 Encounter for immunization: Secondary | ICD-10-CM

## 2020-11-05 DIAGNOSIS — Z860100 Personal history of colon polyps, unspecified: Secondary | ICD-10-CM

## 2020-11-05 DIAGNOSIS — R739 Hyperglycemia, unspecified: Secondary | ICD-10-CM | POA: Diagnosis not present

## 2020-11-05 DIAGNOSIS — Z0001 Encounter for general adult medical examination with abnormal findings: Secondary | ICD-10-CM | POA: Diagnosis not present

## 2020-11-05 DIAGNOSIS — Z1211 Encounter for screening for malignant neoplasm of colon: Secondary | ICD-10-CM | POA: Diagnosis not present

## 2020-11-05 DIAGNOSIS — I10 Essential (primary) hypertension: Secondary | ICD-10-CM | POA: Diagnosis not present

## 2020-11-05 DIAGNOSIS — D509 Iron deficiency anemia, unspecified: Secondary | ICD-10-CM | POA: Diagnosis not present

## 2020-11-05 DIAGNOSIS — E7800 Pure hypercholesterolemia, unspecified: Secondary | ICD-10-CM

## 2020-11-05 LAB — URINALYSIS, ROUTINE W REFLEX MICROSCOPIC
Bilirubin Urine: NEGATIVE
Hgb urine dipstick: NEGATIVE
Ketones, ur: NEGATIVE
Leukocytes,Ua: NEGATIVE
Nitrite: NEGATIVE
RBC / HPF: NONE SEEN (ref 0–?)
Specific Gravity, Urine: 1.01 (ref 1.000–1.030)
Total Protein, Urine: NEGATIVE
Urine Glucose: NEGATIVE
Urobilinogen, UA: 0.2 (ref 0.0–1.0)
pH: 7 (ref 5.0–8.0)

## 2020-11-05 LAB — BASIC METABOLIC PANEL
BUN: 16 mg/dL (ref 6–23)
CO2: 26 mEq/L (ref 19–32)
Calcium: 9.7 mg/dL (ref 8.4–10.5)
Chloride: 102 mEq/L (ref 96–112)
Creatinine, Ser: 0.74 mg/dL (ref 0.40–1.20)
GFR: 79.46 mL/min (ref >60.00)
Glucose, Bld: 101 mg/dL — ABNORMAL HIGH (ref 70–99)
Potassium: 3.5 mEq/L (ref 3.5–5.1)
Sodium: 138 mEq/L (ref 135–145)

## 2020-11-05 LAB — FERRITIN: Ferritin: 121.5 ng/mL (ref 10.0–291.0)

## 2020-11-05 LAB — VITAMIN B12: Vitamin B-12: 1306 pg/mL — ABNORMAL HIGH (ref 211–911)

## 2020-11-05 LAB — CBC WITH DIFFERENTIAL/PLATELET
Basophils Absolute: 0.1 10*3/uL (ref 0.0–0.1)
Basophils Relative: 0.9 % (ref 0.0–3.0)
Eosinophils Absolute: 0.1 10*3/uL (ref 0.0–0.7)
Eosinophils Relative: 1.8 % (ref 0.0–5.0)
HCT: 37.1 % (ref 36.0–46.0)
Hemoglobin: 13 g/dL (ref 12.0–15.0)
Lymphocytes Relative: 21.3 % (ref 12.0–46.0)
Lymphs Abs: 1.7 10*3/uL (ref 0.7–4.0)
MCHC: 35 g/dL (ref 30.0–36.0)
MCV: 90.6 fl (ref 78.0–100.0)
Monocytes Absolute: 0.8 10*3/uL (ref 0.1–1.0)
Monocytes Relative: 9.3 % (ref 3.0–12.0)
Neutro Abs: 5.4 10*3/uL (ref 1.4–7.7)
Neutrophils Relative %: 66.7 % (ref 43.0–77.0)
Platelets: 237 10*3/uL (ref 150.0–400.0)
RBC: 4.1 Mil/uL (ref 3.87–5.11)
RDW: 13.4 % (ref 11.5–15.5)
WBC: 8.1 10*3/uL (ref 4.0–10.5)

## 2020-11-05 LAB — LIPID PANEL
Cholesterol: 201 mg/dL — ABNORMAL HIGH (ref 0–200)
HDL: 45.2 mg/dL (ref >39.00)
LDL Cholesterol: 117 mg/dL — ABNORMAL HIGH (ref 0–99)
NonHDL: 155.82
Total CHOL/HDL Ratio: 4
Triglycerides: 195 mg/dL — ABNORMAL HIGH (ref 0.0–149.0)
VLDL: 39 mg/dL (ref 0.0–40.0)

## 2020-11-05 LAB — HEMOGLOBIN A1C: Hgb A1c MFr Bld: 5.6 % (ref 4.6–6.5)

## 2020-11-05 LAB — IBC PANEL
Iron: 79 ug/dL (ref 42–145)
Saturation Ratios: 19.1 % — ABNORMAL LOW (ref 20.0–50.0)
TIBC: 413 ug/dL (ref 250.0–450.0)
Transferrin: 295 mg/dL (ref 212.0–360.0)

## 2020-11-05 LAB — VITAMIN D 25 HYDROXY (VIT D DEFICIENCY, FRACTURES): VITD: 27.41 ng/mL — ABNORMAL LOW (ref 30.00–100.00)

## 2020-11-05 LAB — TSH: TSH: 1.06 u[IU]/mL (ref 0.35–5.50)

## 2020-11-05 LAB — HEPATIC FUNCTION PANEL
ALT: 15 U/L (ref 0–35)
AST: 19 U/L (ref 0–37)
Albumin: 4.4 g/dL (ref 3.5–5.2)
Alkaline Phosphatase: 71 U/L (ref 39–117)
Bilirubin, Direct: 0.1 mg/dL (ref 0.0–0.3)
Total Bilirubin: 0.8 mg/dL (ref 0.2–1.2)
Total Protein: 7.4 g/dL (ref 6.0–8.3)

## 2020-11-05 MED ORDER — LOSARTAN POTASSIUM 50 MG PO TABS: 50.0000 mg | ORAL_TABLET | Freq: Every day | ORAL | 3 refills | Status: DC

## 2020-11-05 MED ORDER — HYDROCHLOROTHIAZIDE 25 MG PO TABS: 25.0000 mg | ORAL_TABLET | Freq: Every day | ORAL | 3 refills | Status: DC

## 2020-11-05 NOTE — Progress Notes (Signed)
Patient ID: Alyssa Ho, female   DOB: 1945/08/19, 75 y.o.   MRN: [id number]         Chief Complaint:: wellness exam and low vit d, hld, htn, hyperglycemia       HPI:  Alyssa Ho is a 75 y.o. female here for wellness exam; declines covid booster, shingrix, mamogram o/w up to date with prevetive referrals and immunizations                        Also not taking vit d.  Pt denies chest pain, increased sob or doe, wheezing, orthopnea, PND, increased LE swelling, palpitations, dizziness or syncope.  . Pt denies polydipsia, polyuria, or new focal neuro s/s.   Pt denies fever, wt loss, night sweats, loss of appetite, or other constitutional symptoms  No other new complaints.  No overt bleeding.   Bp at home , 140/90   Wt Readings from Last 3 Encounters:  11/05/20 186 lb (84.4 kg)  11/03/18 175 lb (79.4 kg)  10/28/17 187 lb (84.8 kg)   BP Readings from Last 3 Encounters:  11/05/20 (!) 152/70  11/03/18 (!) 144/88  10/28/17 136/84   Immunization History  Administered Date(s) Administered   Fluad Quad(high Dose 65+) 11/03/2018, 01/07/2020, 11/05/2020   Influenza Split 12/12/2010   Influenza Whole 12/02/2007, 03/28/2009, 11/29/2009   Influenza, High Dose Seasonal PF 01/12/2013, 01/04/2015, 03/11/2016, 12/24/2016, 10/28/2017   Influenza,inj,Quad PF,6+ Mos 11/01/2013   PFIZER(Purple Top)SARS-COV-2 Vaccination 04/07/2019, 04/30/2019, 01/30/2020   Pneumococcal Conjugate-13 02/14/2015   Pneumococcal Polysaccharide-23 08/04/2012   Td 02/25/2008   Tdap 12/12/2010   Zoster, Live 12/02/2007   Health Maintenance Due  Topic Date Due   COLONOSCOPY (Pts 45-31yr Insurance coverage will need to be confirmed)  07/25/2017      Past Medical History:  Diagnosis Date   ALLERGIC RHINITIS 12/02/2007   ANEMIA-IRON DEFICIENCY 12/02/2007   COLONIC POLYPS, HX OF 12/02/2007   Hyperlipidemia 02/16/2015   HYPERTENSION 12/02/2007   Past Surgical History:  Procedure Laterality Date   ABDOMINAL HYSTERECTOMY      CATARACT EXTRACTION     TONSILLECTOMY      reports that she has never smoked. She has never used smokeless tobacco. She reports that she does not drink alcohol and does not use drugs. family history includes Alcohol abuse in her father and paternal grandfather; Cancer in her father and mother; Diabetes in her father and paternal uncle; Heart disease in her paternal grandfather; Stroke in her paternal grandfather. Allergies  Allergen Reactions   Fosamax [Alendronate Sodium] Other (See Comments)    GI upset   Current Outpatient Medications on File Prior to Visit  Medication Sig Dispense Refill   aspirin 81 MG tablet Take 81 mg by mouth daily.       fexofenadine (ALLEGRA) 180 MG tablet Take 1 tablet (180 mg total) by mouth daily as needed for allergies or rhinitis. 90 tablet 3   Multiple Vitamins-Minerals (ADEKS) chewable tablet Chew 1 tablet by mouth daily.     Vitamin D, Ergocalciferol, (DRISDOL) 1.25 MG (50000 UT) CAPS capsule Take 1 capsule (50,000 Units total) by mouth every 7 (seven) days. 12 capsule 0   No current facility-administered medications on file prior to visit.        ROS:  All others reviewed and negative.  Objective        PE:  BP (!) 152/70 (BP Location: Right Arm, Patient Position: Sitting, Cuff Size: Large)   Pulse 75  Temp 98.3 F (36.8 C) (Oral)   Ht '5\' 5"'$  (1.651 m)   Wt 186 lb (84.4 kg)   SpO2 96%   BMI 30.95 kg/m                 Constitutional: Pt appears in NAD               HENT: Head: NCAT.                Right Ear: External ear normal.                 Left Ear: External ear normal.                Eyes: . Pupils are equal, round, and reactive to light. Conjunctivae and EOM are normal               Nose: without d/c or deformity               Neck: Neck supple. Gross normal ROM               Cardiovascular: Normal rate and regular rhythm.                 Pulmonary/Chest: Effort normal and breath sounds without rales or wheezing.                 Abd:  Soft, NT, ND, + BS, no organomegaly               Neurological: Pt is alert. At baseline orientation, motor grossly intact               Skin: Skin is warm. No rashes, no other new lesions, LE edema - none               Psychiatric: Pt behavior is normal without agitation   Micro: none  Cardiac tracings I have personally interpreted today:  none  Pertinent Radiological findings (summarize): none   Lab Results  Component Value Date   WBC 8.1 11/05/2020   HGB 13.0 11/05/2020   HCT 37.1 11/05/2020   PLT 237.0 11/05/2020   GLUCOSE 101 (H) 11/05/2020   CHOL 201 (H) 11/05/2020   TRIG 195.0 (H) 11/05/2020   HDL 45.20 11/05/2020   LDLDIRECT 123.0 10/28/2017   LDLCALC 117 (H) 11/05/2020   ALT 15 11/05/2020   AST 19 11/05/2020   NA 138 11/05/2020   K 3.5 11/05/2020   CL 102 11/05/2020   CREATININE 0.74 11/05/2020   BUN 16 11/05/2020   CO2 26 11/05/2020   TSH 1.06 11/05/2020   HGBA1C 5.6 11/05/2020   Assessment/Plan:  Alyssa Ho is a 75 y.o. White or Caucasian [1] female with  has a past medical history of ALLERGIC RHINITIS (12/02/2007), ANEMIA-IRON DEFICIENCY (12/02/2007), COLONIC POLYPS, HX OF (12/02/2007), Hyperlipidemia (02/16/2015), and HYPERTENSION (12/02/2007).  Encounter for well adult exam with abnormal findings Age and sex appropriate education and counseling updated with regular exercise and diet Referrals for preventative services - declines mammogram for now, ok for colonoscopy Immunizations addressed - declines covid booster, shingrix,  Smoking counseling  - none needed Evidence for depression or other mood disorder - none significant Most recent labs reviewed. I have personally reviewed and have noted: 1) the patient's medical and social history 2) The patient's current medications and supplements 3) The patient's height, weight, and BMI have been recorded in the chart   Vitamin D deficiency Last vitamin D Lab Results  Component Value Date   VD25OH  27.41 (L) 11/05/2020   Low, to start oral replacement   Hyperlipidemia Lab Results  Component Value Date   LDLCALC 117 (H) 11/05/2020   Uncontrolled, declines statin, for diet for now and also check cardiac CT score    Hyperglycemia Lab Results  Component Value Date   HGBA1C 5.6 11/05/2020   Stable, pt to continue current medical treatment  - diet   Essential hypertension BP Readings from Last 3 Encounters:  11/05/20 (!) 152/70  11/03/18 (!) 144/88  10/28/17 136/84   Uncontrolled, pt states BP at home < 140/90, pt to continue medical treatment losartan, hct, but also continue to monitor closely at home    [facility] [address] DEFICIENCY Also for iron labs r/o deficiency  COLONIC POLYPS, HX OF For colonoscopy  Followup: Return in about 1 year (around 11/05/2021).  Cathlean Cower, MD 11/11/2020 4:24 PM [facility]

## 2020-11-05 NOTE — Patient Instructions (Addendum)
You had the flu shot today  Please consider checking with your insurance about the Shingrix shot  Please consider the Novavax covid vaccine coming out probably in Oct 2022  Please take OTC Vitamin D3 at 2000 units per day, indefinitely  You will be contacted regarding the referral for: colonoscopy  Please call if you change your mind about treatment for the high cholesterol with at statin, or the Cardiac CT score  Please continue all other medications as before, and refills have been done if requested.  Please have the pharmacy call with any other refills you may need.  Please continue your efforts at being more active, low cholesterol diet, and weight control.  You are otherwise up to date with prevention measures today.  Please keep your appointments with your specialists as you may have planned  Please go to the LAB at the blood drawing area for the tests to be done  You will be contacted by phone if any changes need to be made immediately.  Otherwise, you will receive a letter about your results with an explanation, but please check with MyChart first.  Please remember to sign up for MyChart if you have not done so, as this will be important to you in the future with finding out test results, communicating by private email, and scheduling acute appointments online when needed.  Please make an Appointment to return for your 1 year visit, or sooner if needed, with Lab testing by Appointment as well, to be done about 3-5 days before at the [facility] (so this is for TWO appointments - please see the scheduling desk as you leave)  Due to the ongoing Covid 19 pandemic, our lab now requires an appointment for any labs done at our office.  If you need labs done and do not have an appointment, please call our office ahead of time to schedule before presenting to the lab for your testing.

## 2020-11-11 ENCOUNTER — Encounter: Payer: Self-pay | Admitting: Internal Medicine

## 2020-11-11 NOTE — Assessment & Plan Note (Signed)
Lab Results  Component Value Date   LDLCALC 117 (H) 11/05/2020   Uncontrolled, declines statin, for diet for now and also check cardiac CT score

## 2020-11-11 NOTE — Assessment & Plan Note (Signed)
Lab Results  Component Value Date   HGBA1C 5.6 11/05/2020   Stable, pt to continue current medical treatment  - diet

## 2020-11-11 NOTE — Assessment & Plan Note (Signed)
For colonoscopy 

## 2020-11-11 NOTE — Assessment & Plan Note (Signed)
BP Readings from Last 3 Encounters:  11/05/20 (!) 152/70  11/03/18 (!) 144/88  10/28/17 136/84   Uncontrolled, pt states BP at home < 140/90, pt to continue medical treatment losartan, hct, but also continue to monitor closely at home

## 2020-11-11 NOTE — Assessment & Plan Note (Signed)
Last vitamin D Lab Results  Component Value Date   VD25OH 27.41 (L) 11/05/2020   Low, to start oral replacement  

## 2020-11-11 NOTE — Assessment & Plan Note (Signed)
Also for iron labs r/o deficiency

## 2020-11-11 NOTE — Assessment & Plan Note (Addendum)
Age and sex appropriate education and counseling updated with regular exercise and diet Referrals for preventative services - declines mammogram for now, ok for colonoscopy Immunizations addressed - declines covid booster, shingrix,  Smoking counseling  - none needed Evidence for depression or other mood disorder - none significant Most recent labs reviewed. I have personally reviewed and have noted: 1) the patient's medical and social history 2) The patient's current medications and supplements 3) The patient's height, weight, and BMI have been recorded in the chart

## 2021-02-01 ENCOUNTER — Telehealth: Payer: Self-pay | Admitting: Internal Medicine

## 2021-02-01 NOTE — Telephone Encounter (Signed)
No note needed 

## 2021-04-24 NOTE — Telephone Encounter (Signed)
NOTE NOT NEEDED ?

## 2021-09-23 DIAGNOSIS — D3132 Benign neoplasm of left choroid: Secondary | ICD-10-CM | POA: Diagnosis not present

## 2021-09-23 DIAGNOSIS — Z961 Presence of intraocular lens: Secondary | ICD-10-CM | POA: Diagnosis not present

## 2021-09-23 DIAGNOSIS — H52203 Unspecified astigmatism, bilateral: Secondary | ICD-10-CM | POA: Diagnosis not present

## 2021-11-06 ENCOUNTER — Encounter: Payer: Medicare Other | Admitting: Internal Medicine

## 2021-11-08 ENCOUNTER — Other Ambulatory Visit: Payer: Self-pay | Admitting: Internal Medicine

## 2021-11-08 NOTE — Telephone Encounter (Signed)
Please refill as per office routine med refill policy (all routine meds to be refilled for 3 mo or monthly (per pt preference) up to one year from last visit, then month to month grace period for 3 mo, then further med refills will have to be denied) ? ?

## 2021-12-17 ENCOUNTER — Other Ambulatory Visit: Payer: Self-pay | Admitting: Internal Medicine

## 2021-12-17 NOTE — Telephone Encounter (Signed)
Please refill as per office routine med refill policy (all routine meds to be refilled for 3 mo or monthly (per pt preference) up to one year from last visit, then month to month grace period for 3 mo, then further med refills will have to be denied) ? ?

## 2022-01-01 NOTE — Progress Notes (Unsigned)
Subjective:   Alyssa Ho is a 76 y.o. female who presents for an Initial Medicare Annual Wellness Visit. I connected with  Alyssa Ho on 01/02/22 by a audio enabled telemedicine application and verified that I am speaking with the correct person using two identifiers.  Patient Location: Home  Provider Location: Home Office  I discussed the limitations of evaluation and management by telemedicine. The patient expressed understanding and agreed to proceed.  Review of Systems    Deferred to PCP Cardiac Risk Factors include: advanced age (>75mn, >>24women);dyslipidemia;hypertension;obesity (BMI >30kg/m2)     Objective:    There were no vitals filed for this visit. There is no height or weight on file to calculate BMI.     01/02/2022    9:27 AM  Advanced Directives  Does Patient Have a Medical Advance Directive? Yes  Type of AParamedicof ADundeeLiving will  Does patient want to make changes to medical advance directive? No - Patient declined  Copy of HTescottin Chart? No - copy requested    Current Medications (verified) Outpatient Encounter Medications as of 01/02/2022  Medication Sig   aspirin 81 MG tablet Take 81 mg by mouth daily.     cholecalciferol (VITAMIN D3) 25 MCG (1000 UNIT) tablet Take 2,000 Units by mouth daily.   fexofenadine (ALLEGRA) 180 MG tablet Take 1 tablet (180 mg total) by mouth daily as needed for allergies or rhinitis.   hydrochlorothiazide (HYDRODIURIL) 25 MG tablet TAKE ONE TABLET BY MOUTH ONCE DAILY   losartan (COZAAR) 50 MG tablet TAKE ONE TABLET BY MOUTH ONCE DAILY   Multiple Vitamins-Minerals (ADEKS) chewable tablet Chew 1 tablet by mouth daily. (Patient not taking: Reported on 01/02/2022)   Vitamin D, Ergocalciferol, (DRISDOL) 1.25 MG (50000 UT) CAPS capsule Take 1 capsule (50,000 Units total) by mouth every 7 (seven) days. (Patient not taking: Reported on 01/02/2022)   No facility-administered  encounter medications on file as of 01/02/2022.    Allergies (verified) Fosamax [alendronate sodium]   History: Past Medical History:  Diagnosis Date   ALLERGIC RHINITIS 12/02/2007   ANEMIA-IRON DEFICIENCY 12/02/2007   COLONIC POLYPS, HX OF 12/02/2007   Hyperlipidemia 02/16/2015   HYPERTENSION 12/02/2007   Past Surgical History:  Procedure Laterality Date   ABDOMINAL HYSTERECTOMY     CATARACT EXTRACTION     TONSILLECTOMY     Family History  Problem Relation Age of Onset   Cancer Mother        colon   Cancer Father        colon   Alcohol abuse Father    Diabetes Father    Diabetes Paternal Uncle    Alcohol abuse Paternal Grandfather    Heart disease Paternal Grandfather    Stroke Paternal Grandfather    Social History   Socioeconomic History   Marital status: Married    Spouse name: Not on file   Number of children: 3   Years of education: some college   Highest education level: Not on file  Occupational History   Occupation: part-time sEcologist4 days per wk.  Tobacco Use   Smoking status: Never   Smokeless tobacco: Never  Vaping Use   Vaping Use: Never used  Substance and Sexual Activity   Alcohol use: No   Drug use: No   Sexual activity: Not Currently  Other Topics Concern   Not on file  Social History Narrative   Not on file   Social Determinants  of Health   Financial Resource Strain: Low Risk  (01/02/2022)   Overall Financial Resource Strain (CARDIA)    Difficulty of Paying Living Expenses: Not hard at all  Food Insecurity: No Food Insecurity (01/02/2022)   Hunger Vital Sign    Worried About Running Out of Food in the Last Year: Never true    Ran Out of Food in the Last Year: Never true  Transportation Needs: No Transportation Needs (01/02/2022)   PRAPARE - Hydrologist (Medical): No    Lack of Transportation (Non-Medical): No  Physical Activity: Inactive (01/02/2022)   Exercise Vital Sign    Days of Exercise per  Week: 0 days    Minutes of Exercise per Session: 0 min  Stress: No Stress Concern Present (01/02/2022)   [place]    Feeling of Stress : Not at all  Social Connections: Moderately Integrated (01/02/2022)   Social Connection and Isolation Panel [NHANES]    Frequency of Communication with Friends and Family: More than three times a week    Frequency of Social Gatherings with Friends and Family: More than three times a week    Attends Religious Services: 1 to 4 times per year    Active Member of [employer] or Organizations: No    Attends Music therapist: Never    Marital Status: Married    Tobacco Counseling Counseling given: Not Answered   Clinical Intake:  Pre-visit preparation completed: Yes  Pain : No/denies pain     Nutritional Status: BMI > 30  Obese Nutritional Risks: None Diabetes: No  How often do you need to have someone help you when you read instructions, pamphlets, or other written materials from your doctor or pharmacy?: 1 - Never What is the last grade level you completed in school?: some college  Diabetic?No  Interpreter Needed?: No  Information entered by :: Emelia Loron RN   Activities of Daily Living    01/02/2022    9:26 AM  In your present state of health, do you have any difficulty performing the following activities:  Hearing? 0  Vision? 0  Difficulty concentrating or making decisions? 0  Walking or climbing stairs? 0  Dressing or bathing? 0  Doing errands, shopping? 0  Preparing Food and eating ? N  Using the Toilet? N  In the past six months, have you accidently leaked urine? N  Do you have problems with loss of bowel control? N  Managing your Medications? N  Managing your Finances? N  Housekeeping or managing your Housekeeping? N    Patient Care Team: Biagio Borg, MD as PCP - General  Indicate any recent Medical Services you may have received from other than  [facility] providers in the past year (date may be approximate).     Assessment:   This is a routine wellness examination for Alyssa Ho.  Hearing/Vision screen No results found.  Dietary issues and exercise activities discussed: Current Exercise Habits: The patient does not participate in regular exercise at present, Exercise limited by: orthopedic condition(s)   Goals Addressed             This Visit's Progress    Patient Stated       Maintain current health status      Depression Screen    01/02/2022    9:36 AM 11/05/2020   10:09 AM 11/05/2020    9:56 AM 11/03/2018    8:51 AM 10/28/2017  8:00 AM 08/13/2016    9:00 AM 02/14/2015    2:20 PM  PHQ 2/9 Scores  PHQ - 2 Score 0 0 0 1 0 0 0  PHQ- 9 Score     0 0     Fall Risk    01/02/2022    9:27 AM 11/05/2020   10:09 AM 11/05/2020    9:56 AM 11/03/2018    8:51 AM 10/28/2017    8:47 AM  Fall Risk   Falls in the past year? 0 0 0 0 No  Number falls in past yr: 0 0 0    Injury with Fall? 0 0 0    Risk for fall due to : No Fall Risks      Follow up Falls evaluation completed        FALL RISK PREVENTION PERTAINING TO THE HOME:  Any stairs in or around the home? Yes  If so, are there any without handrails? Yes  Home free of loose throw rugs in walkways, pet beds, electrical cords, etc? Yes  Adequate lighting in your home to reduce risk of falls? Yes   ASSISTIVE DEVICES UTILIZED TO PREVENT FALLS:  Life alert? No  Use of a cane, walker or w/c? No  Grab bars in the bathroom? No  Shower chair or bench in shower? No  Elevated toilet seat or a handicapped toilet? No   Cognitive Function:        01/02/2022    9:28 AM  6CIT Screen  What Year? 0 points  What month? 0 points  What time? 0 points  Count back from 20 0 points  Months in reverse 0 points  Repeat phrase 0 points  Total Score 0 points    Immunizations Immunization History  Administered Date(s) Administered   Fluad Quad(high Dose 65+) 11/03/2018, 01/07/2020,  11/05/2020   Influenza Split 12/12/2010   Influenza Whole 12/02/2007, 03/28/2009, 11/29/2009   Influenza, High Dose Seasonal PF 01/12/2013, 01/04/2015, 03/11/2016, 12/24/2016, 10/28/2017   Influenza,inj,Quad PF,6+ Mos 11/01/2013   PFIZER(Purple Top)SARS-COV-2 Vaccination 04/07/2019, 04/30/2019, 01/30/2020   Pneumococcal Conjugate-13 02/14/2015   Pneumococcal Polysaccharide-23 08/04/2012   Td 02/25/2008   Tdap 12/12/2010   Zoster, Live 12/02/2007    TDAP status: Due, Education has been provided regarding the importance of this vaccine. Advised may receive this vaccine at local pharmacy or Health Dept. Aware to provide a copy of the vaccination record if obtained from local pharmacy or Health Dept. Verbalized acceptance and understanding.  Flu Vaccine status: Due, Education has been provided regarding the importance of this vaccine. Advised may receive this vaccine at local pharmacy or Health Dept. Aware to provide a copy of the vaccination record if obtained from local pharmacy or Health Dept. Verbalized acceptance and understanding.  Pneumococcal vaccine status: Up to date  Covid-19 vaccine status: Information provided on how to obtain vaccines.   Qualifies for Shingles Vaccine? Yes   Zostavax completed No   Shingrix Completed?: No.    Education has been provided regarding the importance of this vaccine. Patient has been advised to call insurance company to determine out of pocket expense if they have not yet received this vaccine. Advised may also receive vaccine at local pharmacy or Health Dept. Verbalized acceptance and understanding.  Screening Tests Health Maintenance  Topic Date Due   Zoster Vaccines- Shingrix (1 of 2) Never done   COVID-19 Vaccine (4 - Pfizer series) 03/26/2020   TETANUS/TDAP  12/11/2020   INFLUENZA VACCINE  09/24/2021   Medicare Annual Wellness (  AWV)  01/03/2023   Pneumonia Vaccine 51+ Years old  Completed   DEXA SCAN  Completed   Hepatitis C Screening   Completed   HPV VACCINES  Aged Out   COLONOSCOPY (Pts 45-23yr Insurance coverage will need to be confirmed)  DHolcombMaintenance Due  Topic Date Due   Zoster Vaccines- Shingrix (1 of 2) Never done   COVID-19 Vaccine (4 - Pfizer series) 03/26/2020   TETANUS/TDAP  12/11/2020   INFLUENZA VACCINE  09/24/2021    Colorectal cancer screening: Type of screening: Colonoscopy. Completed 07/26/2007. Repeat every never years  Mammogram status: No longer required due to age.  Bone Density status: Completed 10/20/16. Results reflect: Bone density results: NORMAL. Repeat every once years.  Lung Cancer Screening: (Low Dose CT Chest recommended if Age 76-80years, 30 pack-year currently smoking OR have quit w/in 15years.) does not qualify.   Additional Screening:  Hepatitis C Screening: does qualify; Completed 02/14/15  Vision Screening: Recommended annual ophthalmology exams for early detection of glaucoma and other disorders of the eye. Is the patient up to date with their annual eye exam?  Yes  Who is the provider or what is the name of the office in which the patient attends annual eye exams? Dr. MEllie Lunch If pt is not established with a provider, would they like to be referred to a provider to establish care?  N/A .   Dental Screening: Recommended annual dental exams for proper oral hygiene  Community Resource Referral / Chronic Care Management: CRR required this visit?  No   CCM required this visit?  No      Plan:     I have personally reviewed and noted the following in the patient's chart:   Medical and social history Use of alcohol, tobacco or illicit drugs  Current medications and supplements including opioid prescriptions. Patient is not currently taking opioid prescriptions. Functional ability and status Nutritional status Physical activity Advanced directives List of other physicians Hospitalizations, surgeries, and ER visits in  previous 12 months Vitals Screenings to include cognitive, depression, and falls Referrals and appointments  In addition, I have reviewed and discussed with patient certain preventive protocols, quality metrics, and best practice recommendations. A written personalized care plan for preventive services as well as general preventive health recommendations were provided to patient.     JMichiel Cowboy RN   01/02/2022   Nurse Notes:  Ms. MLukacs, Thank you for taking time to come for your Medicare Wellness Visit. I appreciate your ongoing commitment to your health goals. Please review the following plan we discussed and let me know if I can assist you in the future.   These are the goals we discussed:  Goals      Patient Stated     Maintain current health status        This is a list of the screening recommended for you and due dates:  Health Maintenance  Topic Date Due   Zoster (Shingles) Vaccine (1 of 2) Never done   COVID-19 Vaccine (4 - Pfizer series) 03/26/2020   Tetanus Vaccine  12/11/2020   Flu Shot  09/24/2021   Medicare Annual Wellness Visit  01/03/2023   Pneumonia Vaccine  Completed   DEXA scan (bone density measurement)  Completed   Hepatitis C Screening: USPSTF Recommendation to screen - Ages 154-79yo.  Completed   HPV Vaccine  Aged Out   Colon Cancer Screening  Discontinued

## 2022-01-01 NOTE — Patient Instructions (Signed)

## 2022-01-02 ENCOUNTER — Ambulatory Visit (INDEPENDENT_AMBULATORY_CARE_PROVIDER_SITE_OTHER): Payer: Medicare Other | Admitting: *Deleted

## 2022-01-02 DIAGNOSIS — Z0000 Encounter for general adult medical examination without abnormal findings: Secondary | ICD-10-CM | POA: Diagnosis not present

## 2022-01-06 ENCOUNTER — Encounter: Payer: Self-pay | Admitting: Internal Medicine

## 2022-01-06 ENCOUNTER — Ambulatory Visit (INDEPENDENT_AMBULATORY_CARE_PROVIDER_SITE_OTHER): Payer: Medicare Other | Admitting: Internal Medicine

## 2022-01-06 VITALS — BP 144/72 | HR 76 | Temp 97.7°F | Ht 65.0 in | Wt 193.5 lb

## 2022-01-06 DIAGNOSIS — E7800 Pure hypercholesterolemia, unspecified: Secondary | ICD-10-CM | POA: Diagnosis not present

## 2022-01-06 DIAGNOSIS — Z23 Encounter for immunization: Secondary | ICD-10-CM

## 2022-01-06 DIAGNOSIS — Z0001 Encounter for general adult medical examination with abnormal findings: Secondary | ICD-10-CM | POA: Diagnosis not present

## 2022-01-06 DIAGNOSIS — R739 Hyperglycemia, unspecified: Secondary | ICD-10-CM

## 2022-01-06 DIAGNOSIS — I10 Essential (primary) hypertension: Secondary | ICD-10-CM | POA: Diagnosis not present

## 2022-01-06 DIAGNOSIS — E538 Deficiency of other specified B group vitamins: Secondary | ICD-10-CM

## 2022-01-06 DIAGNOSIS — E559 Vitamin D deficiency, unspecified: Secondary | ICD-10-CM

## 2022-01-06 MED ORDER — LOSARTAN POTASSIUM 50 MG PO TABS: 50.0000 mg | ORAL_TABLET | Freq: Every day | ORAL | 3 refills | Status: DC

## 2022-01-06 MED ORDER — HYDROCHLOROTHIAZIDE 25 MG PO TABS: 25.0000 mg | ORAL_TABLET | Freq: Every day | ORAL | 3 refills | Status: DC

## 2022-01-06 NOTE — Assessment & Plan Note (Signed)
Lab Results  Component Value Date   HGBA1C 5.6 11/05/2020   Stable, pt to continue current medical treatment - diet, wt control, excercise

## 2022-01-06 NOTE — Patient Instructions (Signed)
Please continue all other medications as before, and refills have been done if requested.  Please have the pharmacy call with any other refills you may need.  Please continue your efforts at being more active, low cholesterol diet, and weight control.  You are otherwise up to date with prevention measures today.  Please keep your appointments with your specialists as you may have planned  Please go to the LAB at the blood drawing area for the tests to be done - tomorrow at the [place] will be contacted by phone if any changes need to be made immediately.  Otherwise, you will receive a letter about your results with an explanation, but please check with MyChart first.  Please remember to sign up for MyChart if you have not done so, as this will be important to you in the future with finding out test results, communicating by private email, and scheduling acute appointments online when needed.  Please make an Appointment to return for your 1 year visit, or sooner if needed, with Lab testing by Appointment as well, to be done about 3-5 days before at the [facility] (so this is for TWO appointments - please see the scheduling desk as you leave)

## 2022-01-06 NOTE — Progress Notes (Signed)
Patient ID: Alyssa Ho, female   DOB: 1946-02-24, 76 y.o.   MRN: [id number]         Chief Complaint:: wellness exam and elevated BP HTN, low vit d, hld, hyperglycemia       HPI:  Alyssa Ho is a 76 y.o. female here for wellness exam; due for shingrix and flu shots today, decliens covid booster and tdap, o.w up to date                        Also BP at home is usually 120s - 130's/70 - 56s.  Taking 2000 u qd Vit D3.  Pt denies chest pain, increased sob or doe, wheezing, orthopnea, PND, increased LE swelling, palpitations, dizziness or syncope.   Pt denies polydipsia, polyuria, or new focal neuro s/s.   Pt denies fever, wt loss, night sweats, loss of appetite, or other constitutional symptoms     Wt Readings from Last 3 Encounters:  01/06/22 193 lb 8 oz (87.8 kg)  11/05/20 186 lb (84.4 kg)  11/03/18 175 lb (79.4 kg)   BP Readings from Last 3 Encounters:  01/06/22 (!) 144/72  11/05/20 (!) 152/70  11/03/18 (!) 144/88   Immunization History  Administered Date(s) Administered   Fluad Quad(high Dose 65+) 11/03/2018, 01/07/2020, 11/05/2020   Influenza Split 12/12/2010   Influenza Whole 12/02/2007, 03/28/2009, 11/29/2009   Influenza, High Dose Seasonal PF 01/12/2013, 01/04/2015, 03/11/2016, 12/24/2016, 10/28/2017   Influenza,inj,Quad PF,6+ Mos 11/01/2013   PFIZER(Purple Top)SARS-COV-2 Vaccination 04/07/2019, 04/30/2019, 01/30/2020   Pneumococcal Conjugate-13 02/14/2015   Pneumococcal Polysaccharide-23 08/04/2012   Td 02/25/2008   Tdap 12/12/2010   Zoster, Live 12/02/2007   Health Maintenance Due  Topic Date Due   Zoster Vaccines- Shingrix (1 of 2) Never done   INFLUENZA VACCINE  09/24/2021      Past Medical History:  Diagnosis Date   ALLERGIC RHINITIS 12/02/2007   ANEMIA-IRON DEFICIENCY 12/02/2007   COLONIC POLYPS, HX OF 12/02/2007   Hyperlipidemia 02/16/2015   HYPERTENSION 12/02/2007   Past Surgical History:  Procedure Laterality Date   ABDOMINAL HYSTERECTOMY      CATARACT EXTRACTION     TONSILLECTOMY      reports that she has never smoked. She has never used smokeless tobacco. She reports that she does not drink alcohol and does not use drugs. family history includes Alcohol abuse in her father and paternal grandfather; Cancer in her father and mother; Diabetes in her father and paternal uncle; Heart disease in her paternal grandfather; Stroke in her paternal grandfather. Allergies  Allergen Reactions   Fosamax [Alendronate Sodium] Other (See Comments)    GI upset   Current Outpatient Medications on File Prior to Visit  Medication Sig Dispense Refill   aspirin 81 MG tablet Take 81 mg by mouth daily.       cholecalciferol (VITAMIN D3) 25 MCG (1000 UNIT) tablet Take 2,000 Units by mouth daily.     fexofenadine (ALLEGRA) 180 MG tablet Take 1 tablet (180 mg total) by mouth daily as needed for allergies or rhinitis. 90 tablet 3   Multiple Vitamins-Minerals (ADEKS) chewable tablet Chew 1 tablet by mouth daily.     No current facility-administered medications on file prior to visit.        ROS:  All others reviewed and negative.  Objective        PE:  BP (!) 144/72   Pulse 76   Temp 97.7 F (36.5 C) (Oral)   Ht 5'  5" (1.651 m)   Wt 193 lb 8 oz (87.8 kg)   SpO2 97%   BMI 32.20 kg/m                 Constitutional: Pt appears in NAD               HENT: Head: NCAT.                Right Ear: External ear normal.                 Left Ear: External ear normal.                Eyes: . Pupils are equal, round, and reactive to light. Conjunctivae and EOM are normal               Nose: without d/c or deformity               Neck: Neck supple. Gross normal ROM               Cardiovascular: Normal rate and regular rhythm.                 Pulmonary/Chest: Effort normal and breath sounds without rales or wheezing.                Abd:  Soft, NT, ND, + BS, no organomegaly               Neurological: Pt is alert. At baseline orientation, motor grossly  intact               Skin: Skin is warm. No rashes, no other new lesions, LE edema - none               Psychiatric: Pt behavior is normal without agitation   Micro: none  Cardiac tracings I have personally interpreted today:  none  Pertinent Radiological findings (summarize): none   Lab Results  Component Value Date   WBC 8.1 11/05/2020   HGB 13.0 11/05/2020   HCT 37.1 11/05/2020   PLT 237.0 11/05/2020   GLUCOSE 101 (H) 11/05/2020   CHOL 201 (H) 11/05/2020   TRIG 195.0 (H) 11/05/2020   HDL 45.20 11/05/2020   LDLDIRECT 123.0 10/28/2017   LDLCALC 117 (H) 11/05/2020   ALT 15 11/05/2020   AST 19 11/05/2020   NA 138 11/05/2020   K 3.5 11/05/2020   CL 102 11/05/2020   CREATININE 0.74 11/05/2020   BUN 16 11/05/2020   CO2 26 11/05/2020   TSH 1.06 11/05/2020   HGBA1C 5.6 11/05/2020   Assessment/Plan:  [name] is a 76 y.o. White or Caucasian [1] female with  has a past medical history of ALLERGIC RHINITIS (12/02/2007), ANEMIA-IRON DEFICIENCY (12/02/2007), COLONIC POLYPS, HX OF (12/02/2007), Hyperlipidemia (02/16/2015), and HYPERTENSION (12/02/2007).  Vitamin D deficiency Last vitamin D Lab Results  Component Value Date   VD25OH 27.41 (L) 11/05/2020   Low, to start oral replacement   Encounter for well adult exam with abnormal findings Age and sex appropriate education and counseling updated with regular exercise and diet Referrals for preventative services - none needed Immunizations addressed - declines covid booster and tdap; ok for shingrix #1at the pharmacy and flu shot today here Smoking counseling  - none needed Evidence for depression or other mood disorder - none significant Most recent labs reviewed. I have personally reviewed and have noted: 1) the patient's medical and social history 2) The patient's current medications and  supplements 3) The patient's height, weight, and BMI have been recorded in the chart   Hyperlipidemia Lab Results  Component Value  Date   LDLCALC 117 (H) 11/05/2020   Uncontrolled, pt declines statin and nexlizet for now, but would consider repatha as her husband also takes this   Hyperglycemia Lab Results  Component Value Date   HGBA1C 5.6 11/05/2020   Stable, pt to continue current medical treatment - diet, wt control, excercise   Essential hypertension BP Readings from Last 3 Encounters:  01/06/22 (!) 144/72  11/05/20 (!) 152/70  11/03/18 (!) 144/88   Uncontrolled, but pt states controlled at home, declines med change for now, , pt to continue medical treatment losartan 50 mg qd, HCT 25 mg qd  Followup: Return in about 1 year (around 01/07/2023).  Cathlean Cower, MD 01/06/2022 9:45 AM [facility]

## 2022-01-06 NOTE — Assessment & Plan Note (Signed)
Last vitamin D Lab Results  Component Value Date   VD25OH 27.41 (L) 11/05/2020   Low, to start oral replacement

## 2022-01-06 NOTE — Assessment & Plan Note (Signed)
BP Readings from Last 3 Encounters:  01/06/22 (!) 144/72  11/05/20 (!) 152/70  11/03/18 (!) 144/88   Uncontrolled, but pt states controlled at home, declines med change for now, , pt to continue medical treatment losartan 50 mg qd, HCT 25 mg qd

## 2022-01-06 NOTE — Assessment & Plan Note (Signed)
Lab Results  Component Value Date   LDLCALC 117 (H) 11/05/2020   Uncontrolled, pt declines statin and nexlizet for now, but would consider repatha as her husband also takes this

## 2022-01-06 NOTE — Assessment & Plan Note (Addendum)
Age and sex appropriate education and counseling updated with regular exercise and diet Referrals for preventative services - none needed Immunizations addressed - declines covid booster and tdap; ok for shingrix #1at the pharmacy and flu shot today here Smoking counseling  - none needed Evidence for depression or other mood disorder - none significant Most recent labs reviewed. I have personally reviewed and have noted: 1) the patient's medical and social history 2) The patient's current medications and supplements 3) The patient's height, weight, and BMI have been recorded in the chart

## 2022-06-06 ENCOUNTER — Encounter: Payer: Self-pay | Admitting: *Deleted

## 2022-06-06 ENCOUNTER — Telehealth: Payer: Self-pay | Admitting: *Deleted

## 2022-06-06 NOTE — Transitions of Care (Post Inpatient/ED Visit) (Signed)
   06/06/2022  Name: [name] MRN: [id number] DOB: March 28, 1945  Today's TOC FU Call Status: Today's TOC FU Call Status:: Unsuccessul Call (1st Attempt) Unsuccessful Call (1st Attempt) Date: 06/06/22  Attempted to reach the patient regarding the most recent Inpatient visit; left HIPAA compliant voice message requesting call back  Follow Up Plan: Additional outreach attempts will be made to reach the patient to complete the Transitions of Care (Post Inpatient visit) call.   Caryl Pina, RN, BSN, CCRN Alumnus RN CM Care Coordination/ Transition of Care- [facility] Care Management [phone]: direct office

## 2022-06-09 ENCOUNTER — Encounter: Payer: Self-pay | Admitting: *Deleted

## 2022-06-09 ENCOUNTER — Telehealth: Payer: Self-pay | Admitting: *Deleted

## 2022-06-09 NOTE — Patient Outreach (Signed)
  Care Coordination   Initial Visit Note   06/09/2022 Name: Alyssa Ho MRN: [id number] DOB: Mar 05, 1945  Alyssa Ho is a 77 y.o. year old female who sees Corwin Levins, MD for primary care. I spoke with  [name] by phone today.  What matters to the patients health and wellness today?  "I have not had any hospital visits anywhere- in over 30 years.  I am doing fine, not having any problems at all.  I am independent, drive myself, manage my own medication.  I will call you if something comes up about the notification you got that said I was recently hospitalized"   Goals Addressed             This Visit's Progress    Care Coordination Activities: no follow up required   On track     Interventions Today    Flowsheet Row Most Recent Value  Chronic Disease   Chronic disease during today's visit Hypertension (HTN)  General Interventions   General Interventions Discussed/Reviewed General Interventions Discussed, Doctor Visits  Doctor Visits Discussed/Reviewed Doctor Visits Discussed, PCP  PCP/Specialist Visits Compliance with follow-up visit  Nutrition Interventions   Nutrition Discussed/Reviewed Nutrition Discussed  Pharmacy Interventions   Pharmacy Dicussed/Reviewed Pharmacy Topics Discussed  [Full medication review completed,  no concerns or discrepancies identified,  confirmed patient obtained/ is taking all newly Rx'd medications as instructed,  self-manages medications and denies questions/ concerns around medications today]               TOC Interventions Today    Flowsheet Row Most Recent Value  TOC Interventions   TOC Interventions Discussed/Reviewed TOC Interventions Discussed  [verified per patient report that she has not had recent hospital visit- notified leadership accordingly]      SDOH assessments and interventions completed:  Yes  SDOH Interventions Today    Flowsheet Row Most Recent Value  SDOH Interventions   Food Insecurity  Interventions AMB Referral, Intervention Not Indicated  Transportation Interventions Intervention Not Indicated  [drives self]       Care Coordination Interventions:  Yes, provided   Follow up plan: No further intervention required.   Encounter Outcome:  Pt. Visit Completed

## 2022-07-07 ENCOUNTER — Other Ambulatory Visit: Payer: Self-pay | Admitting: Internal Medicine

## 2022-07-07 DIAGNOSIS — D509 Iron deficiency anemia, unspecified: Secondary | ICD-10-CM

## 2022-07-07 DIAGNOSIS — E7800 Pure hypercholesterolemia, unspecified: Secondary | ICD-10-CM

## 2022-07-07 DIAGNOSIS — E538 Deficiency of other specified B group vitamins: Secondary | ICD-10-CM

## 2022-07-07 DIAGNOSIS — E559 Vitamin D deficiency, unspecified: Secondary | ICD-10-CM

## 2022-07-07 DIAGNOSIS — R739 Hyperglycemia, unspecified: Secondary | ICD-10-CM

## 2022-11-05 DIAGNOSIS — D3132 Benign neoplasm of left choroid: Secondary | ICD-10-CM | POA: Diagnosis not present

## 2022-11-05 DIAGNOSIS — H52203 Unspecified astigmatism, bilateral: Secondary | ICD-10-CM | POA: Diagnosis not present

## 2022-11-05 DIAGNOSIS — Z961 Presence of intraocular lens: Secondary | ICD-10-CM | POA: Diagnosis not present

## 2022-12-08 ENCOUNTER — Ambulatory Visit (INDEPENDENT_AMBULATORY_CARE_PROVIDER_SITE_OTHER): Payer: Medicare Other

## 2022-12-08 VITALS — Ht 65.0 in | Wt 185.0 lb

## 2022-12-08 DIAGNOSIS — Z0000 Encounter for general adult medical examination without abnormal findings: Secondary | ICD-10-CM | POA: Diagnosis not present

## 2022-12-08 NOTE — Progress Notes (Signed)
Subjective:   Alyssa Ho is a 77 y.o. female who presents for Medicare Annual (Subsequent) preventive examination.  Visit Complete: Virtual I connected with  [name] on 12/08/22 by a audio enabled telemedicine application and verified that I am speaking with the correct person using two identifiers.  Patient Location: Home  Provider Location: Office/Clinic  I discussed the limitations of evaluation and management by telemedicine. The patient expressed understanding and agreed to proceed.  Vital Signs: Because this visit was a virtual/telehealth visit, some criteria may be missing or patient reported. Any vitals not documented were not able to be obtained and vitals that have been documented are patient reported.  Because this visit was a virtual/telehealth visit, some criteria may be missing or patient reported. Any vitals not documented were not able to be obtained and vitals that have been documented are patient reported.   Cardiac Risk Factors include: advanced age (>29men, >47 women);hypertension;dyslipidemia     Objective:    Today's Vitals   12/08/22 1033  Weight: 185 lb (83.9 kg)  Height: 5\' 5"  (1.651 m)   Body mass index is 30.79 kg/m.     12/08/2022   10:40 AM 01/02/2022    9:27 AM  Advanced Directives  Does Patient Have a Medical Advance Directive? Yes Yes  Type of Estate agent of [place];Living will Healthcare Power of [place];Living will  Does patient want to make changes to medical advance directive?  No - Patient declined  Copy of Healthcare Power of Attorney in Chart? No - copy requested No - copy requested    Current Medications (verified) Outpatient Encounter Medications as of 12/08/2022  Medication Sig   aspirin 81 MG tablet Take 81 mg by mouth daily.     fexofenadine (ALLEGRA) 180 MG tablet Take 1 tablet (180 mg total) by mouth daily as needed for allergies or rhinitis.   hydrochlorothiazide (HYDRODIURIL) 25 MG  tablet Take 1 tablet (25 mg total) by mouth daily.   losartan (COZAAR) 50 MG tablet Take 1 tablet (50 mg total) by mouth daily.   cholecalciferol (VITAMIN D3) 25 MCG (1000 UNIT) tablet Take 2,000 Units by mouth daily. (Patient not taking: Reported on 12/08/2022)   Multiple Vitamins-Minerals (ADEKS) chewable tablet Chew 1 tablet by mouth daily. (Patient not taking: Reported on 12/08/2022)   No facility-administered encounter medications on file as of 12/08/2022.    Allergies (verified) Fosamax [alendronate sodium]   History: Past Medical History:  Diagnosis Date   ALLERGIC RHINITIS 12/02/2007   ANEMIA-IRON DEFICIENCY 12/02/2007   COLONIC POLYPS, HX OF 12/02/2007   Hyperlipidemia 02/16/2015   HYPERTENSION 12/02/2007   Past Surgical History:  Procedure Laterality Date   ABDOMINAL HYSTERECTOMY     CATARACT EXTRACTION     TONSILLECTOMY     Family History  Problem Relation Age of Onset   Cancer Mother        colon   Cancer Father        colon   Alcohol abuse Father    Diabetes Father    Diabetes Paternal Uncle    Alcohol abuse Paternal Grandfather    Heart disease Paternal Grandfather    Stroke Paternal Grandfather    Social History   Socioeconomic History   Marital status: Married    Spouse name: Leonette Most   Number of children: 3   Years of education: some college   Highest education level: Not on file  Occupational History   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  Occupation: Retired  Tobacco Use   Smoking status: Never   Smokeless tobacco: Never  Vaping Use   Vaping status: Never Used  Substance and Sexual Activity   Alcohol use: No   Drug use: No   Sexual activity: Not Currently  Other Topics Concern   Not on file  Social History Narrative   Lives with husband, one bird and one dog.   Social Determinants of Health   Financial Resource Strain: Low Risk  (12/08/2022)   Overall Financial Resource Strain (CARDIA)    Difficulty of Paying Living  Expenses: Not hard at all  Food Insecurity: No Food Insecurity (12/08/2022)   Hunger Vital Sign    Worried About Running Out of Food in the Last Year: Never true    Ran Out of Food in the Last Year: Never true  Transportation Needs: No Transportation Needs (12/08/2022)   PRAPARE - Administrator, Civil Service (Medical): No    Lack of Transportation (Non-Medical): No  Physical Activity: Insufficiently Active (12/08/2022)   Exercise Vital Sign    Days of Exercise per Week: 7 days    Minutes of Exercise per Session: 20 min  Stress: No Stress Concern Present (12/08/2022)   [employer] of Occupational Health - Occupational Stress Questionnaire    Feeling of Stress : Only a little  Social Connections: Moderately Isolated (12/08/2022)   Social Connection and Isolation Panel [NHANES]    Frequency of Communication with Friends and Family: More than three times a week    Frequency of Social Gatherings with Friends and Family: More than three times a week    Attends Religious Services: Never    Database administrator or Organizations: No    Attends Engineer, structural: Never    Marital Status: Married    Tobacco Counseling Counseling given: Not Answered   Clinical Intake:  Pre-visit preparation completed: Yes  Pain : No/denies pain     BMI - recorded: 30.79 Nutritional Risks: None Diabetes: No  How often do you need to have someone help you when you read instructions, pamphlets, or other written materials from your doctor or pharmacy?: 1 - Never  Interpreter Needed?: No  Information entered by :: Ercil Cassis, RMA   Activities of Daily Living    12/08/2022   10:37 AM 01/02/2022    9:26 AM  In your present state of health, do you have any difficulty performing the following activities:  Hearing? 0 0  Vision? 0 0  Difficulty concentrating or making decisions? 0 0  Walking or climbing stairs? 0 0  Dressing or bathing? 0 0  Doing errands,  shopping? 0 0  Preparing Food and eating ? N N  Using the Toilet? N N  In the past six months, have you accidently leaked urine? N N  Do you have problems with loss of bowel control? N N  Managing your Medications? N N  Managing your Finances? N N  Housekeeping or managing your Housekeeping? N N    Patient Care Team: Corwin Levins, MD as PCP - Claire Shown, [facility]  Indicate any recent Medical Services you may have received from other than [facility] providers in the past year (date may be approximate).     Assessment:   This is a routine wellness examination for Alyssa Ho.  Hearing/Vision screen Hearing Screening - Comments:: Denies hearing difficulties   Vision Screening - Comments:: Wears eyeglasses   Goals Addressed  This Visit's Progress    Patient Stated   On track    Maintain current health status      Depression Screen    12/08/2022   10:46 AM 01/06/2022    9:17 AM 01/06/2022    9:08 AM 01/02/2022    9:36 AM 11/05/2020   10:09 AM 11/05/2020    9:56 AM 11/03/2018    8:51 AM  PHQ 2/9 Scores  PHQ - 2 Score 0 0 0 0 0 0 1  PHQ- 9 Score 0  0        Fall Risk    12/08/2022   10:40 AM 01/06/2022    9:17 AM 01/06/2022    9:08 AM 01/02/2022    9:27 AM 11/05/2020   10:09 AM  Fall Risk   Falls in the past year? 0 0 0 0 0  Number falls in past yr: 0 0 0 0 0  Injury with Fall? 0 0 0 0 0  Risk for fall due to : No Fall Risks  No Fall Risks No Fall Risks   Follow up Falls prevention discussed;Falls evaluation completed  Falls evaluation completed Falls evaluation completed     MEDICARE RISK AT HOME: Medicare Risk at Home Any stairs in or around the home?: Yes If so, are there any without handrails?: Yes Home free of loose throw rugs in walkways, pet beds, electrical cords, etc?: Yes Adequate lighting in your home to reduce risk of falls?: Yes Life alert?: No Use of a cane, walker or w/c?: No Grab bars in the bathroom?: No Shower chair  or bench in shower?: Yes Elevated toilet seat or a handicapped toilet?: Yes  TIMED UP AND GO:  Was the test performed?  No    Cognitive Function:        12/08/2022   10:42 AM 01/02/2022    9:28 AM  6CIT Screen  What Year? 0 points 0 points  What month? 0 points 0 points  What time? 0 points 0 points  Count back from 20 0 points 0 points  Months in reverse 0 points 0 points  Repeat phrase 0 points 0 points  Total Score 0 points 0 points    Immunizations Immunization History  Administered Date(s) Administered   Fluad Quad(high Dose 65+) 11/03/2018, 01/07/2020, 11/05/2020, 01/06/2022   Influenza Split 12/12/2010   Influenza Whole 12/02/2007, 03/28/2009, 11/29/2009   Influenza, High Dose Seasonal PF 01/12/2013, 01/04/2015, 03/11/2016, 12/24/2016, 10/28/2017   Influenza,inj,Quad PF,6+ Mos 11/01/2013   PFIZER(Purple Top)SARS-COV-2 Vaccination 04/07/2019, 04/30/2019, 01/30/2020   Pneumococcal Conjugate-13 02/14/2015   Pneumococcal Polysaccharide-23 08/04/2012   Td 02/25/2008   Tdap 12/12/2010   Zoster, Live 12/02/2007    TDAP status: Due, Education has been provided regarding the importance of this vaccine. Advised may receive this vaccine at local pharmacy or Health Dept. Aware to provide a copy of the vaccination record if obtained from local pharmacy or Health Dept. Verbalized acceptance and understanding.  Flu Vaccine status: Due, Education has been provided regarding the importance of this vaccine. Advised may receive this vaccine at local pharmacy or Health Dept. Aware to provide a copy of the vaccination record if obtained from local pharmacy or Health Dept. Verbalized acceptance and understanding.  Pneumococcal vaccine status: Up to date  Covid-19 vaccine status: Completed vaccines  Qualifies for Shingles Vaccine? Yes   Zostavax completed Yes   Shingrix Completed?: No.    Education has been provided regarding the importance of this vaccine. Patient has been advised  to  call insurance company to determine out of pocket expense if they have not yet received this vaccine. Advised may also receive vaccine at local pharmacy or Health Dept. Verbalized acceptance and understanding.  Screening Tests Health Maintenance  Topic Date Due   Zoster Vaccines- Shingrix (1 of 2) 12/30/1995   DTaP/Tdap/Td (3 - Td or Tdap) 12/11/2020   INFLUENZA VACCINE  09/25/2022   COVID-19 Vaccine (4 - 2023-24 season) 10/26/2022   Medicare Annual Wellness (AWV)  12/08/2023   Pneumonia Vaccine 5+ Years old  Completed   DEXA SCAN  Completed   Hepatitis C Screening  Completed   HPV VACCINES  Aged Out   Colonoscopy  Discontinued    Health Maintenance  Health Maintenance Due  Topic Date Due   Zoster Vaccines- Shingrix (1 of 2) 12/30/1995   DTaP/Tdap/Td (3 - Td or Tdap) 12/11/2020   INFLUENZA VACCINE  09/25/2022   COVID-19 Vaccine (4 - 2023-24 season) 10/26/2022    Colorectal cancer screening: Type of screening: Colonoscopy. Completed 07/26/2007. Repeat every ? years  Mammogram status: Completed 11/28/2007. Repeat every year  Bone Density status: Completed 2018. Results reflect: Bone density results: OSTEOPOROSIS. Repeat every 2 years.  Lung Cancer Screening: (Low Dose CT Chest recommended if Age 21-80 years, 20 pack-year currently smoking OR have quit w/in 15years.) does not qualify.   Lung Cancer Screening Referral: N/A  Additional Screening:  Hepatitis C Screening: does qualify; Completed 02/14/2015  Vision Screening: Recommended annual ophthalmology exams for early detection of glaucoma and other disorders of the eye. Is the patient up to date with their annual eye exam?  Yes  Who is the provider or what is the name of the office in which the patient attends annual eye exams? [facility] If pt is not established with a provider, would they like to be referred to a provider to establish care? No .   Dental Screening: Recommended annual dental exams for  proper oral hygiene   Community Resource Referral / Chronic Care Management: CRR required this visit?  No   CCM required this visit?  No     Plan:     I have personally reviewed and noted the following in the patient's chart:   Medical and social history Use of alcohol, tobacco or illicit drugs  Current medications and supplements including opioid prescriptions. Patient is not currently taking opioid prescriptions. Functional ability and status Nutritional status Physical activity Advanced directives List of other physicians Hospitalizations, surgeries, and ER visits in previous 12 months Vitals Screenings to include cognitive, depression, and falls Referrals and appointments  In addition, I have reviewed and discussed with patient certain preventive protocols, quality metrics, and best practice recommendations. A written personalized care plan for preventive services as well as general preventive health recommendations were provided to patient.     Reedy Biernat L Jrue Yambao, CMA   12/08/2022   After Visit Summary: (MyChart) Due to this being a telephonic visit, the after visit summary with patients personalized plan was offered to patient via MyChart   Nurse Notes: Patient is due for a Tdap and a Flu vaccine, which she would like to get during her wellness visit with PCP next month.  Patient has not had a colonoscopy since 2009, please discuss during visit with PCP.  She also has not had a mammogram since 2004.  These screenings can be discussed with PCP as to patient getting these screening done for her health maintenance.  She had no other concerns to address today.

## 2022-12-08 NOTE — Patient Instructions (Addendum)
Ms. Billard , Thank you for taking time to come for your Medicare Wellness Visit. I appreciate your ongoing commitment to your health goals. Please review the following plan we discussed and let me know if I can assist you in the future.   Referrals/Orders/Follow-Ups/Clinician Recommendations: You are due for you Tetanus and Flu vaccine.  You are also due for a mammogram, which will be discussed with your PCP during the next visit.  These screening are paid for by your insurance, Bone Density, Colonoscopy and Mammograms.  Please inquire about these to your PCP, if you should or should not be screened.    This is a list of the screening recommended for you and due dates:  Health Maintenance  Topic Date Due   Zoster (Shingles) Vaccine (1 of 2) 12/30/1995   DTaP/Tdap/Td vaccine (3 - Td or Tdap) 12/11/2020   Flu Shot  09/25/2022   COVID-19 Vaccine (4 - 2023-24 season) 10/26/2022   Medicare Annual Wellness Visit  12/08/2023   Pneumonia Vaccine  Completed   DEXA scan (bone density measurement)  Completed   Hepatitis C Screening  Completed   HPV Vaccine  Aged Out   Colon Cancer Screening  Discontinued    Advanced directives: (Copy Requested) Please bring a copy of your health care power of attorney and living will to the office to be added to your chart at your convenience.  Next Medicare Annual Wellness Visit scheduled for next year: Yes

## 2023-01-08 ENCOUNTER — Encounter: Payer: Self-pay | Admitting: Internal Medicine

## 2023-01-08 ENCOUNTER — Ambulatory Visit (INDEPENDENT_AMBULATORY_CARE_PROVIDER_SITE_OTHER): Payer: Medicare Other | Admitting: Internal Medicine

## 2023-01-08 VITALS — BP 128/82 | HR 65 | Temp 98.5°F | Ht 65.0 in | Wt 186.0 lb

## 2023-01-08 DIAGNOSIS — E7800 Pure hypercholesterolemia, unspecified: Secondary | ICD-10-CM | POA: Diagnosis not present

## 2023-01-08 DIAGNOSIS — Z0000 Encounter for general adult medical examination without abnormal findings: Secondary | ICD-10-CM

## 2023-01-08 DIAGNOSIS — D509 Iron deficiency anemia, unspecified: Secondary | ICD-10-CM | POA: Diagnosis not present

## 2023-01-08 DIAGNOSIS — R739 Hyperglycemia, unspecified: Secondary | ICD-10-CM

## 2023-01-08 DIAGNOSIS — E559 Vitamin D deficiency, unspecified: Secondary | ICD-10-CM | POA: Diagnosis not present

## 2023-01-08 DIAGNOSIS — R9431 Abnormal electrocardiogram [ECG] [EKG]: Secondary | ICD-10-CM

## 2023-01-08 DIAGNOSIS — E538 Deficiency of other specified B group vitamins: Secondary | ICD-10-CM

## 2023-01-08 DIAGNOSIS — Z23 Encounter for immunization: Secondary | ICD-10-CM

## 2023-01-08 DIAGNOSIS — I10 Essential (primary) hypertension: Secondary | ICD-10-CM | POA: Diagnosis not present

## 2023-01-08 DIAGNOSIS — Z0001 Encounter for general adult medical examination with abnormal findings: Secondary | ICD-10-CM

## 2023-01-08 LAB — VITAMIN B12: Vitamin B-12: 450 pg/mL (ref 211–911)

## 2023-01-08 LAB — LIPID PANEL
Cholesterol: 207 mg/dL — ABNORMAL HIGH (ref 0–200)
HDL: 44.2 mg/dL (ref >39.00)
LDL Cholesterol: 130 mg/dL — ABNORMAL HIGH (ref 0–99)
NonHDL: 162.56
Total CHOL/HDL Ratio: 5
Triglycerides: 164 mg/dL — ABNORMAL HIGH (ref 0.0–149.0)
VLDL: 32.8 mg/dL (ref 0.0–40.0)

## 2023-01-08 LAB — BASIC METABOLIC PANEL
BUN: 20 mg/dL (ref 6–23)
CO2: 27 meq/L (ref 19–32)
Calcium: 9.6 mg/dL (ref 8.4–10.5)
Chloride: 105 meq/L (ref 96–112)
Creatinine, Ser: 0.91 mg/dL (ref 0.40–1.20)
GFR: 61.06 mL/min (ref >60.00)
Glucose, Bld: 105 mg/dL — ABNORMAL HIGH (ref 70–99)
Potassium: 4.3 meq/L (ref 3.5–5.1)
Sodium: 140 meq/L (ref 135–145)

## 2023-01-08 LAB — CBC WITH DIFFERENTIAL/PLATELET
Basophils Absolute: 0.1 10*3/uL (ref 0.0–0.1)
Basophils Relative: 1.1 % (ref 0.0–3.0)
Eosinophils Absolute: 0.2 10*3/uL (ref 0.0–0.7)
Eosinophils Relative: 2.6 % (ref 0.0–5.0)
HCT: 36.3 % (ref 36.0–46.0)
Hemoglobin: 12.4 g/dL (ref 12.0–15.0)
Lymphocytes Relative: 24.6 % (ref 12.0–46.0)
Lymphs Abs: 1.5 10*3/uL (ref 0.7–4.0)
MCHC: 34.1 g/dL (ref 30.0–36.0)
MCV: 93.4 fL (ref 78.0–100.0)
Monocytes Absolute: 0.5 10*3/uL (ref 0.1–1.0)
Monocytes Relative: 8.1 % (ref 3.0–12.0)
Neutro Abs: 3.9 10*3/uL (ref 1.4–7.7)
Neutrophils Relative %: 63.6 % (ref 43.0–77.0)
Platelets: 276 10*3/uL (ref 150.0–400.0)
RBC: 3.89 Mil/uL (ref 3.87–5.11)
RDW: 13.1 % (ref 11.5–15.5)
WBC: 6.1 10*3/uL (ref 4.0–10.5)

## 2023-01-08 LAB — FERRITIN: Ferritin: 97.4 ng/mL (ref 10.0–291.0)

## 2023-01-08 LAB — URINALYSIS, ROUTINE W REFLEX MICROSCOPIC
Bilirubin Urine: NEGATIVE
Hgb urine dipstick: NEGATIVE
Ketones, ur: NEGATIVE
Nitrite: NEGATIVE
RBC / HPF: NONE SEEN (ref 0–?)
Specific Gravity, Urine: 1.015 (ref 1.000–1.030)
Total Protein, Urine: NEGATIVE
Urine Glucose: NEGATIVE
Urobilinogen, UA: 0.2 (ref 0.0–1.0)
pH: 6 (ref 5.0–8.0)

## 2023-01-08 LAB — HEPATIC FUNCTION PANEL
ALT: 13 U/L (ref 0–35)
AST: 16 U/L (ref 0–37)
Albumin: 4.3 g/dL (ref 3.5–5.2)
Alkaline Phosphatase: 79 U/L (ref 39–117)
Bilirubin, Direct: 0.1 mg/dL (ref 0.0–0.3)
Total Bilirubin: 0.8 mg/dL (ref 0.2–1.2)
Total Protein: 7.3 g/dL (ref 6.0–8.3)

## 2023-01-08 LAB — VITAMIN D 25 HYDROXY (VIT D DEFICIENCY, FRACTURES): VITD: 25.5 ng/mL — ABNORMAL LOW (ref 30.00–100.00)

## 2023-01-08 LAB — IBC PANEL
Iron: 95 ug/dL (ref 42–145)
Saturation Ratios: 24.3 % (ref 20.0–50.0)
TIBC: 390.6 ug/dL (ref 250.0–450.0)
Transferrin: 279 mg/dL (ref 212.0–360.0)

## 2023-01-08 LAB — TSH: TSH: 1.16 u[IU]/mL (ref 0.35–5.50)

## 2023-01-08 LAB — HEMOGLOBIN A1C: Hgb A1c MFr Bld: 5.7 % (ref 4.6–6.5)

## 2023-01-08 MED ORDER — HYDROCHLOROTHIAZIDE 25 MG PO TABS: 25.0000 mg | ORAL_TABLET | Freq: Every day | ORAL | 3 refills | Status: DC

## 2023-01-08 MED ORDER — LOSARTAN POTASSIUM 50 MG PO TABS: 50.0000 mg | ORAL_TABLET | Freq: Every day | ORAL | 3 refills | Status: DC

## 2023-01-08 NOTE — Progress Notes (Signed)
Patient ID: Alyssa Ho, female   DOB: October 25, 1945, 77 y.o.   MRN: [id number]         Chief Complaint:: wellness exam and low vit d, hld, hyperglycemia, htn, low iron anemia       HPI:  Alyssa Ho is a 77 y.o. female here for wellness exam; declines all immunizations, o/w up to date                        Also Pt denies chest pain, increased sob or doe, wheezing, orthopnea, PND, increased LE swelling, palpitations, dizziness or syncope.   Pt denies polydipsia, polyuria, or new focal neuro s/s.    Pt denies fever, wt loss, night sweats, loss of appetite, or other constitutional symptoms  Willing for Card CT score   Wt Readings from Last 3 Encounters:  01/08/23 186 lb (84.4 kg)  12/08/22 185 lb (83.9 kg)  01/06/22 193 lb 8 oz (87.8 kg)   BP Readings from Last 3 Encounters:  01/08/23 128/82  01/06/22 (!) 144/72  11/05/20 (!) 152/70   Immunization History  Administered Date(s) Administered   Fluad Quad(high Dose 65+) 11/03/2018, 01/07/2020, 11/05/2020, 01/06/2022   Fluad Trivalent(High Dose 65+) 01/08/2023   Influenza Split 12/12/2010   Influenza Whole 12/02/2007, 03/28/2009, 11/29/2009   Influenza, High Dose Seasonal PF 01/12/2013, 01/04/2015, 03/11/2016, 12/24/2016, 10/28/2017   Influenza,inj,Quad PF,6+ Mos 11/01/2013   PFIZER(Purple Top)SARS-COV-2 Vaccination 04/07/2019, 04/30/2019, 01/30/2020   Pneumococcal Conjugate-13 02/14/2015   Pneumococcal Polysaccharide-23 08/04/2012   Td 02/25/2008   Tdap 12/12/2010   Zoster, Live 12/02/2007   Health Maintenance Due  Topic Date Due   Zoster Vaccines- Shingrix (1 of 2) 12/30/1995   DTaP/Tdap/Td (3 - Td or Tdap) 12/11/2020   COVID-19 Vaccine (4 - 2023-24 season) 10/26/2022      Past Medical History:  Diagnosis Date   ALLERGIC RHINITIS 12/02/2007   ANEMIA-IRON DEFICIENCY 12/02/2007   COLONIC POLYPS, HX OF 12/02/2007   Hyperlipidemia 02/16/2015   HYPERTENSION 12/02/2007   Past Surgical History:  Procedure Laterality Date    ABDOMINAL HYSTERECTOMY     CATARACT EXTRACTION     TONSILLECTOMY      reports that she has never smoked. She has never used smokeless tobacco. She reports that she does not drink alcohol and does not use drugs. family history includes Alcohol abuse in her father and paternal grandfather; Cancer in her father and mother; Diabetes in her father and paternal uncle; Heart disease in her paternal grandfather; Stroke in her paternal grandfather. Allergies  Allergen Reactions   Fosamax [Alendronate Sodium] Other (See Comments)    GI upset   Current Outpatient Medications on File Prior to Visit  Medication Sig Dispense Refill   aspirin 81 MG tablet Take 81 mg by mouth daily.       fexofenadine (ALLEGRA) 180 MG tablet Take 1 tablet (180 mg total) by mouth daily as needed for allergies or rhinitis. 90 tablet 3   cholecalciferol (VITAMIN D3) 25 MCG (1000 UNIT) tablet Take 2,000 Units by mouth daily. (Patient not taking: Reported on 12/08/2022)     Multiple Vitamins-Minerals (ADEKS) chewable tablet Chew 1 tablet by mouth daily. (Patient not taking: Reported on 12/08/2022)     No current facility-administered medications on file prior to visit.        ROS:  All others reviewed and negative.  Objective        PE:  BP 128/82 (BP Location: Right Arm, Patient Position: Sitting, Cuff  Size: Normal)   Pulse 65   Temp 98.5 F (36.9 C) (Oral)   Ht 5\' 5"  (1.651 m)   Wt 186 lb (84.4 kg)   SpO2 98%   BMI 30.95 kg/m                 Constitutional: Pt appears in NAD               HENT: Head: NCAT.                Right Ear: External ear normal.                 Left Ear: External ear normal.                Eyes: . Pupils are equal, round, and reactive to light. Conjunctivae and EOM are normal               Nose: without d/c or deformity               Neck: Neck supple. Gross normal ROM               Cardiovascular: Normal rate and regular rhythm.                 Pulmonary/Chest: Effort normal and breath  sounds without rales or wheezing.                Abd:  Soft, NT, ND, + BS, no organomegaly               Neurological: Pt is alert. At baseline orientation, motor grossly intact               Skin: Skin is warm. No rashes, no other new lesions, LE edema - none               Psychiatric: Pt behavior is normal without agitation   Micro: none  Cardiac tracings I have personally interpreted today:  none  Pertinent Radiological findings (summarize): none   Lab Results  Component Value Date   WBC 6.1 01/08/2023   HGB 12.4 01/08/2023   HCT 36.3 01/08/2023   PLT 276.0 01/08/2023   GLUCOSE 105 (H) 01/08/2023   CHOL 207 (H) 01/08/2023   TRIG 164.0 (H) 01/08/2023   HDL 44.20 01/08/2023   LDLDIRECT 123.0 10/28/2017   LDLCALC 130 (H) 01/08/2023   ALT 13 01/08/2023   AST 16 01/08/2023   NA 140 01/08/2023   K 4.3 01/08/2023   CL 105 01/08/2023   CREATININE 0.91 01/08/2023   BUN 20 01/08/2023   CO2 27 01/08/2023   TSH 1.16 01/08/2023   HGBA1C 5.7 01/08/2023   Assessment/Plan:  Alyssa Ho is a 77 y.o. White or Caucasian [1] female with  has a past medical history of ALLERGIC RHINITIS (12/02/2007), ANEMIA-IRON DEFICIENCY (12/02/2007), COLONIC POLYPS, HX OF (12/02/2007), Hyperlipidemia (02/16/2015), and HYPERTENSION (12/02/2007).  Encounter for well adult exam with abnormal findings Age and sex appropriate education and counseling updated with regular exercise and diet Referrals for preventative services - none needed Immunizations addressed - declines immunizations Smoking counseling  - none needed Evidence for depression or other mood disorder - none significant Most recent labs reviewed. I have personally reviewed and have noted: 1) the patient's medical and social history 2) The patient's current medications and supplements 3) The patient's height, weight, and BMI have been recorded in the chart   Vitamin D deficiency Last vitamin D Lab Results  Component Value Date   VD25OH  25.50 (L) 01/08/2023   Low, to start vit d oral replacement   Hyperlipidemia Lab Results  Component Value Date   LDLCALC 130 (H) 01/08/2023   Uncontrolled, pt declines statin for now but for CT card score  Hyperglycemia Lab Results  Component Value Date   HGBA1C 5.7 01/08/2023   Stable, pt to continue current medical treatment  - diet, wt control   Essential hypertension BP Readings from Last 3 Encounters:  01/08/23 128/82  01/06/22 (!) 144/72  11/05/20 (!) 152/70   Stable, pt to continue medical treatment hct 25 qd  Losartan 50 qd   ANEMIA-IRON DEFICIENCY Lab Results  Component Value Date   WBC 6.1 01/08/2023   HGB 12.4 01/08/2023   HCT 36.3 01/08/2023   MCV 93.4 01/08/2023   PLT 276.0 01/08/2023   Stable, cont currrent tx  Followup: Return in about 1 year (around 01/08/2024).  Oliver Barre, MD 01/10/2023 8:26 PM [facility] [place] [facility]

## 2023-01-08 NOTE — Patient Instructions (Addendum)
Please have your Shingrix (shingles) shots done at your local pharmacy., or the Tdap shot, and RSV shots  Please continue all other medications as before, and refills have been done if requested.  Please have the pharmacy call with any other refills you may need.  Please continue your efforts at being more active, low cholesterol diet, and weight control.  You are otherwise up to date with prevention measures today.  Please keep your appointments with your specialists as you may have planned  You will be contacted regarding the referral for: Cardiac CT score  Please go to the LAB at the blood drawing area for the tests to be done  You will be contacted by phone if any changes need to be made immediately.  Otherwise, you will receive a letter about your results with an explanation, but please check with MyChart first.  Please make an Appointment to return for your 1 year visit, or sooner if needed

## 2023-01-10 ENCOUNTER — Encounter: Payer: Self-pay | Admitting: Internal Medicine

## 2023-01-10 NOTE — Assessment & Plan Note (Signed)
Lab Results  Component Value Date   WBC 6.1 01/08/2023   HGB 12.4 01/08/2023   HCT 36.3 01/08/2023   MCV 93.4 01/08/2023   PLT 276.0 01/08/2023   Stable, cont currrent tx

## 2023-01-10 NOTE — Assessment & Plan Note (Signed)
Age and sex appropriate education and counseling updated with regular exercise and diet Referrals for preventative services - none needed Immunizations addressed - declines immunizations Smoking counseling  - none needed Evidence for depression or other mood disorder - none significant Most recent labs reviewed. I have personally reviewed and have noted: 1) the patient's medical and social history 2) The patient's current medications and supplements 3) The patient's height, weight, and BMI have been recorded in the chart

## 2023-01-10 NOTE — Assessment & Plan Note (Signed)
Lab Results  Component Value Date   HGBA1C 5.7 01/08/2023   Stable, pt to continue current medical treatment  - diet, wt control

## 2023-01-10 NOTE — Assessment & Plan Note (Signed)
BP Readings from Last 3 Encounters:  01/08/23 128/82  01/06/22 (!) 144/72  11/05/20 (!) 152/70   Stable, pt to continue medical treatment hct 25 qd  Losartan 50 qd

## 2023-01-10 NOTE — Assessment & Plan Note (Signed)
Lab Results  Component Value Date   LDLCALC 130 (H) 01/08/2023   Uncontrolled, pt declines statin for now but for CT card score

## 2023-01-10 NOTE — Assessment & Plan Note (Signed)
Last vitamin D Lab Results  Component Value Date   VD25OH 25.50 (L) 01/08/2023   Low, to start vit d oral replacement

## 2023-03-09 ENCOUNTER — Ambulatory Visit (HOSPITAL_BASED_OUTPATIENT_CLINIC_OR_DEPARTMENT_OTHER): Admission: RE | Admit: 2023-03-09 | Payer: Medicare Other | Source: Ambulatory Visit

## 2023-11-27 DIAGNOSIS — D3132 Benign neoplasm of left choroid: Secondary | ICD-10-CM | POA: Diagnosis not present

## 2023-11-27 DIAGNOSIS — Z961 Presence of intraocular lens: Secondary | ICD-10-CM | POA: Diagnosis not present

## 2023-11-27 DIAGNOSIS — H52203 Unspecified astigmatism, bilateral: Secondary | ICD-10-CM | POA: Diagnosis not present

## 2024-01-12 ENCOUNTER — Other Ambulatory Visit: Payer: Self-pay | Admitting: Internal Medicine

## 2024-01-12 ENCOUNTER — Other Ambulatory Visit: Payer: Self-pay

## 2024-01-28 ENCOUNTER — Ambulatory Visit

## 2024-01-28 ENCOUNTER — Encounter: Admitting: Internal Medicine

## 2024-02-11 ENCOUNTER — Encounter: Admitting: Internal Medicine

## 2024-03-08 ENCOUNTER — Encounter: Admitting: Internal Medicine

## 2024-03-09 ENCOUNTER — Encounter: Payer: Self-pay | Admitting: Internal Medicine

## 2024-03-09 ENCOUNTER — Ambulatory Visit: Payer: Self-pay | Admitting: Internal Medicine

## 2024-03-09 ENCOUNTER — Ambulatory Visit: Admitting: Internal Medicine

## 2024-03-09 VITALS — BP 122/78 | HR 75 | Temp 98.6°F | Ht 65.0 in | Wt 188.0 lb

## 2024-03-09 DIAGNOSIS — E7800 Pure hypercholesterolemia, unspecified: Secondary | ICD-10-CM

## 2024-03-09 DIAGNOSIS — Z0000 Encounter for general adult medical examination without abnormal findings: Secondary | ICD-10-CM

## 2024-03-09 DIAGNOSIS — R739 Hyperglycemia, unspecified: Secondary | ICD-10-CM

## 2024-03-09 DIAGNOSIS — D509 Iron deficiency anemia, unspecified: Secondary | ICD-10-CM

## 2024-03-09 DIAGNOSIS — E538 Deficiency of other specified B group vitamins: Secondary | ICD-10-CM

## 2024-03-09 DIAGNOSIS — Z0001 Encounter for general adult medical examination with abnormal findings: Secondary | ICD-10-CM

## 2024-03-09 DIAGNOSIS — Z23 Encounter for immunization: Secondary | ICD-10-CM | POA: Diagnosis not present

## 2024-03-09 DIAGNOSIS — I10 Essential (primary) hypertension: Secondary | ICD-10-CM

## 2024-03-09 DIAGNOSIS — E559 Vitamin D deficiency, unspecified: Secondary | ICD-10-CM

## 2024-03-09 DIAGNOSIS — M8580 Other specified disorders of bone density and structure, unspecified site: Secondary | ICD-10-CM

## 2024-03-09 LAB — URINALYSIS, ROUTINE W REFLEX MICROSCOPIC
Bilirubin Urine: NEGATIVE
Hgb urine dipstick: NEGATIVE
Ketones, ur: NEGATIVE
Nitrite: NEGATIVE
RBC / HPF: NONE SEEN
Specific Gravity, Urine: 1.01 (ref 1.000–1.030)
Total Protein, Urine: NEGATIVE
Urine Glucose: NEGATIVE
Urobilinogen, UA: 0.2 (ref 0.0–1.0)
pH: 6 (ref 5.0–8.0)

## 2024-03-09 LAB — CBC WITH DIFFERENTIAL/PLATELET
Basophils Absolute: 0.1 K/uL (ref 0.0–0.1)
Basophils Relative: 1 % (ref 0.0–3.0)
Eosinophils Absolute: 0.2 K/uL (ref 0.0–0.7)
Eosinophils Relative: 3.7 % (ref 0.0–5.0)
HCT: 34.1 % — ABNORMAL LOW (ref 36.0–46.0)
Hemoglobin: 11.9 g/dL — ABNORMAL LOW (ref 12.0–15.0)
Lymphocytes Relative: 26.9 % (ref 12.0–46.0)
Lymphs Abs: 1.6 K/uL (ref 0.7–4.0)
MCHC: 35 g/dL (ref 30.0–36.0)
MCV: 91.1 fl (ref 78.0–100.0)
Monocytes Absolute: 0.5 K/uL (ref 0.1–1.0)
Monocytes Relative: 8.8 % (ref 3.0–12.0)
Neutro Abs: 3.5 K/uL (ref 1.4–7.7)
Neutrophils Relative %: 59.6 % (ref 43.0–77.0)
Platelets: 214 K/uL (ref 150.0–400.0)
RBC: 3.74 Mil/uL — ABNORMAL LOW (ref 3.87–5.11)
RDW: 13.5 % (ref 11.5–15.5)
WBC: 5.8 K/uL (ref 4.0–10.5)

## 2024-03-09 LAB — HEPATIC FUNCTION PANEL
ALT: 12 U/L (ref 3–35)
AST: 17 U/L (ref 5–37)
Albumin: 4.2 g/dL (ref 3.5–5.2)
Alkaline Phosphatase: 64 U/L (ref 39–117)
Bilirubin, Direct: 0.1 mg/dL (ref 0.1–0.3)
Total Bilirubin: 0.9 mg/dL (ref 0.2–1.2)
Total Protein: 7 g/dL (ref 6.0–8.3)

## 2024-03-09 LAB — IBC PANEL
Iron: 89 ug/dL (ref 42–145)
Saturation Ratios: 22.7 % (ref 20.0–50.0)
TIBC: 392 ug/dL (ref 250.0–450.0)
Transferrin: 280 mg/dL (ref 212.0–360.0)

## 2024-03-09 LAB — LIPID PANEL
Cholesterol: 205 mg/dL — ABNORMAL HIGH (ref 28–200)
HDL: 44.3 mg/dL
LDL Cholesterol: 129 mg/dL — ABNORMAL HIGH (ref 10–99)
NonHDL: 160.68
Total CHOL/HDL Ratio: 5
Triglycerides: 158 mg/dL — ABNORMAL HIGH (ref 10.0–149.0)
VLDL: 31.6 mg/dL (ref 0.0–40.0)

## 2024-03-09 LAB — VITAMIN D 25 HYDROXY (VIT D DEFICIENCY, FRACTURES): VITD: 23.97 ng/mL — ABNORMAL LOW (ref 30.00–100.00)

## 2024-03-09 LAB — TSH: TSH: 0.97 u[IU]/mL (ref 0.35–5.50)

## 2024-03-09 LAB — BASIC METABOLIC PANEL WITH GFR
BUN: 20 mg/dL (ref 6–23)
CO2: 24 meq/L (ref 19–32)
Calcium: 9.3 mg/dL (ref 8.4–10.5)
Chloride: 105 meq/L (ref 96–112)
Creatinine, Ser: 0.77 mg/dL (ref 0.40–1.20)
GFR: 74 mL/min
Glucose, Bld: 116 mg/dL — ABNORMAL HIGH (ref 70–99)
Potassium: 3.3 meq/L — ABNORMAL LOW (ref 3.5–5.1)
Sodium: 138 meq/L (ref 135–145)

## 2024-03-09 LAB — VITAMIN B12: Vitamin B-12: 487 pg/mL (ref 211–911)

## 2024-03-09 LAB — HEMOGLOBIN A1C: Hgb A1c MFr Bld: 5.6 % (ref 4.6–6.5)

## 2024-03-09 LAB — FERRITIN: Ferritin: 79.2 ng/mL (ref 10.0–291.0)

## 2024-03-09 NOTE — Patient Instructions (Signed)
 You had the flu shot today  Please continue all other medications as before, and refills have been done if requested.  Please have the pharmacy call with any other refills you may need.  Please continue your efforts at being more active, low cholesterol diet, and weight control.  You are otherwise up to date with prevention measures today.  Please keep your appointments with your specialists as you may have planned  You will be contacted regarding the referral for: Bone Density testing  Please go to the LAB at the blood drawing area for the tests to be done  You will be contacted by phone if any changes need to be made immediately.  Otherwise, you will receive a letter about your results with an explanation, but please check with MyChart first.  Please make an Appointment to return in 6 months, or sooner if needed

## 2024-03-09 NOTE — Progress Notes (Addendum)
 Patient ID: Alyssa Ho, female   DOB: 08-21-1945, 79 y.o.   MRN: [id number]         Chief Complaint:: wellness exam and hld, osteopenia, low vit d, hyperglycemia, htn, iron deficiency       HPI:  Alyssa Ho is a 79 y.o. female here for wellness exam; for tdap and shingrix  at pharmacy, due for DXA, for flu shot today, o/w up to date                        Also Pt denies chest pain, increased sob or doe, wheezing, orthopnea, PND, increased LE swelling, palpitations, dizziness or syncope.   Pt denies polydipsia, polyuria, or new focal neuro s/s.    Pt denies fever, wt loss, night sweats, loss of appetite, or other constitutional symptoms  No overt bleeding.    Wt Readings from Last 3 Encounters:  03/09/24 188 lb (85.3 kg)  01/08/23 186 lb (84.4 kg)  12/08/22 185 lb (83.9 kg)   BP Readings from Last 3 Encounters:  03/09/24 122/78  01/08/23 128/82  01/06/22 (!) 144/72   Immunization History  Administered Date(s) Administered   Fluad Quad(high Dose 65+) 11/03/2018, 01/07/2020, 11/05/2020, 01/06/2022   Fluad Trivalent(High Dose 65+) 01/08/2023   INFLUENZA, HIGH DOSE SEASONAL PF 01/12/2013, 01/04/2015, 03/11/2016, 12/24/2016, 10/28/2017, 03/09/2024   Influenza Split 12/12/2010   Influenza Whole 12/02/2007, 03/28/2009, 11/29/2009   Influenza,inj,Quad PF,6+ Mos 11/01/2013   PFIZER(Purple Top)SARS-COV-2 Vaccination 04/07/2019, 04/30/2019, 01/30/2020   Pneumococcal Conjugate-13 02/14/2015   Pneumococcal Polysaccharide-23 08/04/2012   Td 02/25/2008   Tdap 12/12/2010   Zoster, Live 12/02/2007   Health Maintenance Due  Topic Date Due   Zoster Vaccines- Shingrix (1 of 2) 12/30/1995   DTaP/Tdap/Td (3 - Td or Tdap) 12/11/2020   Medicare Annual Wellness (AWV)  12/08/2023      Past Medical History:  Diagnosis Date   ALLERGIC RHINITIS 12/02/2007   ANEMIA-IRON DEFICIENCY 12/02/2007   COLONIC POLYPS, HX OF 12/02/2007   Hyperlipidemia 02/16/2015   HYPERTENSION 12/02/2007   Past Surgical  History:  Procedure Laterality Date   ABDOMINAL HYSTERECTOMY     CATARACT EXTRACTION     TONSILLECTOMY      reports that she has never smoked. She has never used smokeless tobacco. She reports that she does not drink alcohol and does not use drugs. family history includes Alcohol abuse in her father and paternal grandfather; Cancer in her father and mother; Diabetes in her father and paternal uncle; Heart disease in her paternal grandfather; Stroke in her paternal grandfather. Allergies[1] Medications Ordered Prior to Encounter[2]      ROS:  All others reviewed and negative.  Objective        PE:  BP 122/78 (BP Location: Right Arm, Patient Position: Sitting, Cuff Size: Normal)   Pulse 75   Temp 98.6 F (37 C) (Oral)   Ht 5' 5 (1.651 m)   Wt 188 lb (85.3 kg)   SpO2 98%   BMI 31.28 kg/m                 Constitutional: Pt appears in NAD               HENT: Head: NCAT.                Right Ear: External ear normal.                 Left Ear: External ear normal.  Eyes: . Pupils are equal, round, and reactive to light. Conjunctivae and EOM are normal               Nose: without d/c or deformity               Neck: Neck supple. Gross normal ROM               Cardiovascular: Normal rate and regular rhythm.                 Pulmonary/Chest: Effort normal and breath sounds without rales or wheezing.                Abd:  Soft, NT, ND, + BS, no organomegaly               Neurological: Pt is alert. At baseline orientation, motor grossly intact               Skin: Skin is warm. No rashes, no other new lesions, LE edema - none               Psychiatric: Pt behavior is normal without agitation   Micro: none  Cardiac tracings I have personally interpreted today:  none  Pertinent Radiological findings (summarize): none   Lab Results  Component Value Date   WBC 5.8 03/09/2024   HGB 11.9 (L) 03/09/2024   HCT 34.1 (L) 03/09/2024   PLT 214.0 03/09/2024   GLUCOSE 116 (H)  03/09/2024   CHOL 205 (H) 03/09/2024   TRIG 158.0 (H) 03/09/2024   HDL 44.30 03/09/2024   LDLDIRECT 123.0 10/28/2017   LDLCALC 129 (H) 03/09/2024   ALT 12 03/09/2024   AST 17 03/09/2024   NA 138 03/09/2024   K 3.3 (L) 03/09/2024   CL 105 03/09/2024   CREATININE 0.77 03/09/2024   BUN 20 03/09/2024   CO2 24 03/09/2024   TSH 0.97 03/09/2024   HGBA1C 5.6 03/09/2024   Assessment/Plan:  Alyssa Ho is a 79 y.o. White or Caucasian [1] female with  has a past medical history of ALLERGIC RHINITIS (12/02/2007), ANEMIA-IRON DEFICIENCY (12/02/2007), COLONIC POLYPS, HX OF (12/02/2007), Hyperlipidemia (02/16/2015), and HYPERTENSION (12/02/2007).  Preventative health care Age and sex appropriate education and counseling updated with regular exercise and diet Referrals for preventative services - for DXA f/u hx of osteopenia Immunizations addressed - for tdap and shingrx at pharmacy, for flu shot today Smoking counseling  - none needed Evidence for depression or other mood disorder - none significant Most recent labs reviewed. I have personally reviewed and have noted: 1) the patient's medical and social history 2) The patient's current medications and supplements 3) The patient's height, weight, and BMI have been recorded in the chart   Vitamin D  deficiency Last vitamin D  Lab Results  Component Value Date   VD25OH 23.97 (L) 03/09/2024   Low, to start oral replacement   Hyperlipidemia Lab Results  Component Value Date   LDLCALC 129 (H) 03/09/2024   uncontrolled, pt not currently taking crestor , declines for now   Hyperglycemia Lab Results  Component Value Date   HGBA1C 5.6 03/09/2024   Stable, pt to continue current medical treatment  - diet, wt control   Essential hypertension BP Readings from Last 3 Encounters:  03/09/24 122/78  01/08/23 128/82  01/06/22 (!) 144/72   Stable, pt to continue medical treatment hct 25 every day, losartan  50 qd   ANEMIA-IRON  DEFICIENCY With no recent overt bleeding, for f/u iron and ferritin lab  Followup: Return in about 6 months (around 09/06/2024).  Lynwood Rush, MD 03/12/2024 7:22 PM [facility]     [1]  Allergies Allergen Reactions   Fosamax  [Alendronate  Sodium] Other (See Comments)    GI upset  [2]  Current Outpatient Medications on File Prior to Visit  Medication Sig Dispense Refill   aspirin 81 MG tablet Take 81 mg by mouth daily.       cholecalciferol (VITAMIN D3) 25 MCG (1000 UNIT) tablet Take 2,000 Units by mouth daily. (Patient not taking: Reported on 12/08/2022)     fexofenadine  (ALLEGRA ) 180 MG tablet Take 1 tablet (180 mg total) by mouth daily as needed for allergies or rhinitis. 90 tablet 3   hydrochlorothiazide  (HYDRODIURIL ) 25 MG tablet TAKE ONE TABLET DAILY 90 tablet 3   losartan  (COZAAR ) 50 MG tablet TAKE ONE TABLET DAILY 90 tablet 3   Multiple Vitamins-Minerals (ADEKS) chewable tablet Chew 1 tablet by mouth daily. (Patient not taking: Reported on 12/08/2022)     No current facility-administered medications on file prior to visit.

## 2024-03-10 ENCOUNTER — Other Ambulatory Visit: Payer: Self-pay | Admitting: Internal Medicine

## 2024-03-10 MED ORDER — POTASSIUM CHLORIDE ER 10 MEQ PO TBCR: 10.0000 meq | EXTENDED_RELEASE_TABLET | Freq: Every day | ORAL | 3 refills | Status: AC

## 2024-03-10 MED ORDER — ROSUVASTATIN CALCIUM 10 MG PO TABS: 10.0000 mg | ORAL_TABLET | Freq: Every day | ORAL | 3 refills | Status: AC

## 2024-03-12 ENCOUNTER — Encounter: Payer: Self-pay | Admitting: Internal Medicine

## 2024-03-12 NOTE — Assessment & Plan Note (Addendum)
 Age and sex appropriate education and counseling updated with regular exercise and diet Referrals for preventative services - for DXA f/u hx of osteopenia Immunizations addressed - for tdap and shingrx at pharmacy, for flu shot today Smoking counseling  - none needed Evidence for depression or other mood disorder - none significant Most recent labs reviewed. I have personally reviewed and have noted: 1) the patient's medical and social history 2) The patient's current medications and supplements 3) The patient's height, weight, and BMI have been recorded in the chart

## 2024-03-12 NOTE — Assessment & Plan Note (Signed)
 Last vitamin D  Lab Results  Component Value Date   VD25OH 23.97 (L) 03/09/2024   Low, to start oral replacement

## 2024-03-12 NOTE — Assessment & Plan Note (Signed)
 Lab Results  Component Value Date   LDLCALC 129 (H) 03/09/2024   uncontrolled, pt not currently taking crestor , declines for now

## 2024-03-12 NOTE — Assessment & Plan Note (Signed)
 Lab Results  Component Value Date   HGBA1C 5.6 03/09/2024   Stable, pt to continue current medical treatment  - diet, wt control

## 2024-03-12 NOTE — Assessment & Plan Note (Signed)
 BP Readings from Last 3 Encounters:  03/09/24 122/78  01/08/23 128/82  01/06/22 (!) 144/72   Stable, pt to continue medical treatment hct 25 every day, losartan  50 qd

## 2024-03-12 NOTE — Assessment & Plan Note (Signed)
 With no recent overt bleeding, for f/u iron and ferritin lab

## 2024-03-23 ENCOUNTER — Ambulatory Visit
